# Patient Record
Sex: Female | Born: 1990 | Race: White | Hispanic: No | Marital: Single | Smoking: Never smoker
Health system: Southern US, Community
[De-identification: ages and names within clinical notes are randomized; demographics above are authoritative.]

## PROBLEM LIST (undated history)

## (undated) ENCOUNTER — Inpatient Hospital Stay (HOSPITAL_COMMUNITY): Payer: Self-pay

## (undated) DIAGNOSIS — J45909 Unspecified asthma, uncomplicated: Secondary | ICD-10-CM

## (undated) DIAGNOSIS — O24419 Gestational diabetes mellitus in pregnancy, unspecified control: Secondary | ICD-10-CM

## (undated) HISTORY — PX: NO PAST SURGERIES: SHX2092

---

## 2015-11-12 ENCOUNTER — Encounter (HOSPITAL_COMMUNITY): Payer: Self-pay | Admitting: Emergency Medicine

## 2015-11-12 ENCOUNTER — Emergency Department (HOSPITAL_COMMUNITY)
Admission: EM | Admit: 2015-11-12 | Discharge: 2015-11-12 | Disposition: A | Discharge status: Home / Self Care | Payer: Medicaid Other | Attending: Emergency Medicine | Admitting: Emergency Medicine

## 2015-11-12 ENCOUNTER — Emergency Department (HOSPITAL_COMMUNITY): Payer: Medicaid Other

## 2015-11-12 DIAGNOSIS — Z91040 Latex allergy status: Secondary | ICD-10-CM | POA: Insufficient documentation

## 2015-11-12 DIAGNOSIS — K920 Hematemesis: Secondary | ICD-10-CM

## 2015-11-12 LAB — COMPREHENSIVE METABOLIC PANEL
ALK PHOS: 40 U/L (ref 38–126)
ALT: 16 U/L (ref 14–54)
AST: 18 U/L (ref 15–41)
Albumin: 3.8 g/dL (ref 3.5–5.0)
Anion gap: 6 (ref 5–15)
BILIRUBIN TOTAL: 0.4 mg/dL (ref 0.3–1.2)
BUN: 8 mg/dL (ref 6–20)
CALCIUM: 9 mg/dL (ref 8.9–10.3)
CO2: 24 mmol/L (ref 22–32)
CREATININE: 0.35 mg/dL — AB (ref 0.44–1.00)
Chloride: 104 mmol/L (ref 101–111)
Glucose, Bld: 79 mg/dL (ref 65–99)
Potassium: 3.8 mmol/L (ref 3.5–5.1)
Sodium: 134 mmol/L — ABNORMAL LOW (ref 135–145)
Total Protein: 6.7 g/dL (ref 6.5–8.1)

## 2015-11-12 LAB — CBC WITH DIFFERENTIAL/PLATELET
Basophils Absolute: 0 10*3/uL (ref 0.0–0.1)
Basophils Relative: 0 %
EOS PCT: 1 %
Eosinophils Absolute: 0.1 10*3/uL (ref 0.0–0.7)
HEMATOCRIT: 33.9 % — AB (ref 36.0–46.0)
HEMOGLOBIN: 11.4 g/dL — AB (ref 12.0–15.0)
LYMPHS ABS: 1.6 10*3/uL (ref 0.7–4.0)
LYMPHS PCT: 18 %
MCH: 31.2 pg (ref 26.0–34.0)
MCHC: 33.6 g/dL (ref 30.0–36.0)
MCV: 92.9 fL (ref 78.0–100.0)
Monocytes Absolute: 0.8 10*3/uL (ref 0.1–1.0)
Monocytes Relative: 9 %
NEUTROS ABS: 6.3 10*3/uL (ref 1.7–7.7)
Neutrophils Relative %: 72 %
Platelets: 191 10*3/uL (ref 150–400)
RBC: 3.65 MIL/uL — AB (ref 3.87–5.11)
RDW: 12.3 % (ref 11.5–15.5)
WBC: 8.8 10*3/uL (ref 4.0–10.5)

## 2015-11-12 LAB — POC OCCULT BLOOD, ED: FECAL OCCULT BLD: NEGATIVE

## 2015-11-12 MED ORDER — FAMOTIDINE 20 MG PO TABS: 20.0000 mg | ORAL_TABLET | Freq: Two times a day (BID) | ORAL | 0 refills | Status: DC

## 2015-11-12 MED ORDER — DIBUCAINE 1 % EX OINT: TOPICAL_OINTMENT | Freq: Three times a day (TID) | CUTANEOUS | 0 refills | Status: DC | PRN

## 2015-11-12 NOTE — ED Provider Notes (Signed)
[facility]-EMERGENCY DEPT Provider Note   CSN: [id number] Arrival date & time: 11/12/15  0831     History   Chief Complaint Chief Complaint  Patient presents with  . Hematemesis  . Palpitations    HPI Taylor Harding is a 25 y.o. female.  Patient who is [redacted] weeks pregnant presents with complaint of hematemesis and vomiting 2 this morning after waking up. She had associated sensation of heart racing. Patient states that she slept poorly last night and had vague upper abdominal discomfort. She has not had symptoms like this in the past. Blood in the vomit was bright red clots reported. Patient has recently had nasal congestion but denies any nosebleeds. She has not had any problems like this during prior pregnancies. No lightheadedness or syncope. No melena or bloody stools. Mild epigastric tenderness currently but no other abdominal pain. No fevers. Patient denies alcohol use, heavy NSAID use. No easy bruising or bleeding from other sites. No treatments prior to arrival. Patient has not had significant GERD with this pregnancy. The onset of this condition was acute. The course is constant. Aggravating factors: none. Alleviating factors: none.        History reviewed. No pertinent past medical history.  There are no active problems to display for this patient.   History reviewed. No pertinent surgical history.  OB History    Gravida Para Term Preterm AB Living   1             SAB TAB Ectopic Multiple Live Births                   Home Medications    Prior to Admission medications   Medication Sig Start Date End Date Taking? Authorizing Provider  albuterol (PROVENTIL HFA;VENTOLIN HFA) 108 (90 Base) MCG/ACT inhaler Inhale 2 puffs into the lungs every 6 (six) hours as needed for wheezing or shortness of breath.   Yes Historical Provider, MD  Prenatal Vit-Fe Fumarate-FA (MULTIVITAMIN-PRENATAL) 27-0.8 MG TABS tablet Take 1 tablet by mouth daily at 12 noon.   Yes Historical  Provider, MD  famotidine (PEPCID) 20 MG tablet Take 1 tablet (20 mg total) by mouth 2 (two) times daily. 11/12/15   Renne Crigler, PA-C    Family History History reviewed. No pertinent family history.  Social History Social History  Substance Use Topics  . Smoking status: Never Smoker  . Smokeless tobacco: Never Used  . Alcohol use No     Allergies   Benadryl [diphenhydramine hcl (sleep)] and Latex   Review of Systems Review of Systems  Constitutional: Negative for fever.  HENT: Positive for congestion. Negative for nosebleeds, rhinorrhea and sore throat.   Eyes: Negative for redness.  Respiratory: Negative for cough.   Cardiovascular: Positive for palpitations. Negative for chest pain.  Gastrointestinal: Positive for abdominal pain, nausea and vomiting. Negative for blood in stool and diarrhea.  Genitourinary: Negative for dysuria.  Musculoskeletal: Negative for myalgias.  Skin: Positive for color change. Negative for rash.  Neurological: Negative for headaches.     Physical Exam Updated Vital Signs BP 123/63   Pulse 65   Temp 98.5 F (36.9 C) (Oral)   Resp 15   Ht 5\' 5"  (1.651 m)   Wt 61.9 kg   SpO2 99%   BMI 22.70 kg/m   Physical Exam  Constitutional: She appears well-developed and well-nourished.  HENT:  Head: Normocephalic and atraumatic.  Nose: Nose normal.  Mouth/Throat: Oropharynx is clear and moist.  Eyes: Conjunctivae are normal. Right  eye exhibits no discharge. Left eye exhibits no discharge.  Neck: Normal range of motion. Neck supple.  Cardiovascular: Normal rate, regular rhythm and normal heart sounds.   No murmur heard. Pulmonary/Chest: Effort normal and breath sounds normal. No respiratory distress. She has no wheezes. She has no rales.  Abdominal: Soft. There is no tenderness.  Neurological: She is alert.  Skin: Skin is warm and dry.  Psychiatric: She has a normal mood and affect.  Nursing note and vitals reviewed.    ED Treatments /  Results  Labs (all labs ordered are listed, but only abnormal results are displayed) Labs Reviewed  CBC WITH DIFFERENTIAL/PLATELET - Abnormal; Notable for the following:       Result Value   RBC 3.65 (*)    Hemoglobin 11.4 (*)    HCT 33.9 (*)    All other components within normal limits  COMPREHENSIVE METABOLIC PANEL - Abnormal; Notable for the following:    Sodium 134 (*)    Creatinine, Ser 0.35 (*)    All other components within normal limits  POC OCCULT BLOOD, ED    Radiology Dg Chest 1 View  Result Date: 11/12/2015 CLINICAL DATA:  Hematemesis EXAM: CHEST 1 VIEW COMPARISON:  None. FINDINGS: The heart size and mediastinal contours are within normal limits. Both lungs are clear. Question old fracture of the of the left fourth rib. Clinical correlation is necessary. IMPRESSION: No active disease. Electronically Signed   By: Natasha MeadLiviu  Pop M.D.   On: 11/12/2015 10:11    Procedures Procedures (including critical care time)  Medications Ordered in ED Medications - No data to display   Initial Impression / Assessment and Plan / ED Course  I have reviewed the triage vital signs and the nursing notes.  Pertinent labs & imaging results that were available during my care of the patient were reviewed by me and considered in my medical decision making (see chart for details).  Clinical Course   Patient seen and examined. Work-up initiated. Discussed with Dr. Radford PaxBeaton.   Vital signs reviewed and are as follows: BP 123/63   Pulse 65   Temp 98.5 F (36.9 C) (Oral)   Resp 15   Ht 5\' 5"  (1.651 m)   Wt 61.9 kg   SpO2 99%   BMI 22.70 kg/m   12:45 PM Patient Feeling better. Discussed results with Dr. Radford PaxBeaton. Patient is comfortable with discharge to home. Patient told to return if she has any persistent vomiting containing blood, abdominal pain, fever, new symptoms or other concerns. She has just established her medical coverage and is going to be scheduling a OB/GYN appointment tomorrow.  Patient verbalizes understanding and agrees with plan.    Final Clinical Impressions(s) / ED Diagnoses   Final diagnoses:  Hematemesis without nausea   Patient with episodes of reported hematemesis and palpitations this morning. EKG is normal. No significant abdominal pain. No concerning features for PUD. No nosebleeds. Vital signs are stable and have remained so during ED stay. Patient is drinking fluids without any difficulty. Do not suspect Boerhaave's. Do not suspect ruptured viscus. Discharged home with Pepcid. Return instructions as above.  New Prescriptions New Prescriptions   FAMOTIDINE (PEPCID) 20 MG TABLET    Take 1 tablet (20 mg total) by mouth 2 (two) times daily.     Renne CriglerJoshua Zen Felling, PA-C 11/12/15 1247    Nelva Nayobert Beaton, MD 11/18/15 [phone]

## 2015-11-12 NOTE — ED Notes (Signed)
PA at bedside.

## 2015-11-12 NOTE — Discharge Instructions (Signed)
Please read and follow all provided instructions.  Your diagnoses today include:  1. Hematemesis without nausea   2. Hematemesis     Tests performed today include:  Blood counts and electrolytes  Blood tests to check kidney function  Chest x-ray - no problems seen  Vital signs. See below for your results today.   Medications prescribed:   Pepcid (famotidine) - antihistamine  You can find this medication over-the-counter.   DO NOT exceed:   20mg  Pepcid every 12 hours  Take any prescribed medications only as directed.  Home care instructions:   Follow any educational materials contained in this packet.  Follow-up instructions: Please follow-up with your primary care provider in the next 3 days for further evaluation of your symptoms.    Return instructions:  SEEK IMMEDIATE MEDICAL ATTENTION IF:  The pain does not go away or becomes severe   A temperature above 101F develops   Repeated vomiting occurs (multiple episodes)   The pain becomes localized to portions of the abdomen. The right side could possibly be appendicitis. In an adult, the left lower portion of the abdomen could be colitis or diverticulitis.   Blood is being passed in stools or vomit for more than 12 hours (bright red or black tarry stools)   You develop chest pain, difficulty breathing, dizziness or fainting, or become confused, poorly responsive, or inconsolable (young children)  If you have any other emergent concerns regarding your health  Your vital signs today were: BP 123/63    Pulse 65    Temp 98.5 F (36.9 C) (Oral)    Resp 15    Ht 5\' 5"  (1.651 m)    Wt 61.9 kg    SpO2 99%    BMI 22.70 kg/m  If your blood pressure (bp) was elevated above 135/85 this visit, please have this repeated by your doctor within one month. --------------

## 2015-11-12 NOTE — ED Triage Notes (Signed)
Patient states she woke up and felt her heart racing. She states she started to throwing up and it was blood and yellow stuff in her vomit. Patient is [redacted] weeks pregnant. Patient also complaining of having a cold and feeling uncomfortable.

## 2015-11-12 NOTE — ED Notes (Signed)
Patient transported to X-ray 

## 2015-11-12 NOTE — ED Notes (Signed)
Wrong documentation at [id number]-KB

## 2015-12-05 ENCOUNTER — Other Ambulatory Visit (HOSPITAL_COMMUNITY)
Admission: RE | Admit: 2015-12-05 | Discharge: 2015-12-05 | Disposition: A | Discharge status: Home / Self Care | Payer: Medicaid Other | Source: Ambulatory Visit | Attending: Certified Nurse Midwife | Admitting: Certified Nurse Midwife

## 2015-12-05 ENCOUNTER — Encounter: Payer: Self-pay | Admitting: Certified Nurse Midwife

## 2015-12-05 ENCOUNTER — Ambulatory Visit (INDEPENDENT_AMBULATORY_CARE_PROVIDER_SITE_OTHER): Payer: Medicaid Other | Admitting: Certified Nurse Midwife

## 2015-12-05 VITALS — BP 119/74 | HR 73 | Temp 98.1°F | Wt 141.0 lb

## 2015-12-05 DIAGNOSIS — Z01419 Encounter for gynecological examination (general) (routine) without abnormal findings: Secondary | ICD-10-CM | POA: Diagnosis present

## 2015-12-05 DIAGNOSIS — Z3492 Encounter for supervision of normal pregnancy, unspecified, second trimester: Secondary | ICD-10-CM

## 2015-12-05 DIAGNOSIS — O0932 Supervision of pregnancy with insufficient antenatal care, second trimester: Secondary | ICD-10-CM | POA: Diagnosis not present

## 2015-12-05 DIAGNOSIS — Z113 Encounter for screening for infections with a predominantly sexual mode of transmission: Secondary | ICD-10-CM | POA: Insufficient documentation

## 2015-12-05 DIAGNOSIS — O0990 Supervision of high risk pregnancy, unspecified, unspecified trimester: Secondary | ICD-10-CM | POA: Insufficient documentation

## 2015-12-05 DIAGNOSIS — O09292 Supervision of pregnancy with other poor reproductive or obstetric history, second trimester: Secondary | ICD-10-CM | POA: Insufficient documentation

## 2015-12-05 MED ORDER — ONDANSETRON HCL 8 MG PO TABS: 8.0000 mg | ORAL_TABLET | Freq: Three times a day (TID) | ORAL | 2 refills | Status: DC | PRN

## 2015-12-05 MED ORDER — OB COMPLETE PETITE 35-5-1-200 MG PO CAPS: 1.0000 | ORAL_CAPSULE | Freq: Every day | ORAL | 12 refills | Status: DC

## 2015-12-05 NOTE — Progress Notes (Signed)
Subjective:    Taylor Harding is being seen today for her first obstetrical visit.  This is a planned pregnancy. She is at [redacted]w[redacted]d gestation. Her obstetrical history is significant for asthma,last exacurbation 3 years ago. Relationship with FOB: significant other, living together. Patient does intend to breast feed. Pregnancy history fully reviewed.  Currently employed: seasonal job.  Had IUD previously.    The information documented in the HPI was reviewed and verified.  Menstrual History: OB History    Gravida Para Term Preterm AB Living   3 1 1   1 1    SAB TAB Ectopic Multiple Live Births   1       1      Menarche age: 25 years of age.  Patient's last menstrual period was 08/06/2015 (exact date).    No past medical history on file.  No past surgical history on file.   (Not in a hospital admission) Allergies  Allergen Reactions  . Benadryl [Diphenhydramine Hcl (Sleep)] Anaphylaxis  . Latex Anaphylaxis    Social History  Substance Use Topics  . Smoking status: Never Smoker  . Smokeless tobacco: Never Used  . Alcohol use No    No family history on file.   Review of Systems Constitutional: negative for weight loss Gastrointestinal: negative for vomiting, + for nausea Genitourinary:negative for genital lesions and vaginal discharge and dysuria Musculoskeletal:negative for back pain Behavioral/Psych: negative for abusive relationship, depression, illegal drug usage and tobacco use    Objective:    BP 119/74   Pulse 73   Temp 98.1 F (36.7 C)   Wt 141 lb (64 kg)   LMP 08/06/2015 (Exact Date) Comment: double shielded, spoke to MD to verify if xray needed.  BMI 23.46 kg/m  General Appearance:    Alert, cooperative, no distress, appears stated age  Head:    Normocephalic, without obvious abnormality, atraumatic  Eyes:    PERRL, conjunctiva/corneas clear, EOM's intact, fundi    benign, both eyes  Ears:    Normal TM's and external ear canals, both ears  Nose:   Nares  normal, septum midline, mucosa normal, no drainage    or sinus tenderness  Throat:   Lips, mucosa, and tongue normal; teeth and gums normal  Neck:   Supple, symmetrical, trachea midline, no adenopathy;    thyroid:  no enlargement/tenderness/nodules; no carotid   bruit or JVD  Back:     Symmetric, no curvature, ROM normal, no CVA tenderness  Lungs:     Clear to auscultation bilaterally, respirations unlabored  Chest Wall:    No tenderness or deformity   Heart:    Regular rate and rhythm, S1 and S2 normal, no murmur, rub   or gallop  Breast Exam:    No tenderness, masses, or nipple abnormality  Abdomen:     Soft, non-tender, bowel sounds active all four quadrants,    no masses, no organomegaly  Genitalia:    Normal female without lesion, discharge or tenderness  Extremities:   Extremities normal, atraumatic, no cyanosis or edema  Pulses:   2+ and symmetric all extremities  Skin:   Skin color, texture, turgor normal, no rashes or lesions  Lymph nodes:   Cervical, supraclavicular, and axillary nodes normal  Neurologic:   CNII-XII intact, normal strength, sensation and reflexes    throughout          Cervix:   Long, thick, closed and posterior.  FHR:145   By doppler.  FH: less than U.   Lab  Review Urine pregnancy test Labs reviewed yes Radiologic studies reviewed no Assessment:    Pregnancy at 571w2d weeks   History of miscarriage @18  weeks with last prenancy Late to prenatal care @18  weeks  Plan:      Prenatal vitamins.  Counseling provided regarding continued use of seat belts, cessation of alcohol consumption, smoking or use of illicit drugs; infection precautions i.e., influenza/TDAP immunizations, toxoplasmosis,CMV, parvovirus, listeria and varicella; workplace safety, exercise during pregnancy; routine dental care, safe medications, sexual activity, hot tubs, saunas, pools, travel, caffeine use, fish and methlymercury, potential toxins, hair treatments, varicose veins Weight gain  recommendations per IOM guidelines reviewed: underweight/BMI< 18.5--> gain 28 - 40 lbs; normal weight/BMI 18.5 - 24.9--> gain 25 - 35 lbs; overweight/BMI 25 - 29.9--> gain 15 - 25 lbs; obese/BMI >30->gain  11 - 20 lbs Problem list reviewed and updated. FIRST/CF mutation testing/NIPT/QUAD SCREEN/fragile X/Ashkenazi Jewish population testing/Spinal muscular atrophy discussed: ordered. Role of ultrasound in pregnancy discussed; fetal survey: ordered. Amniocentesis discussed: not indicated. VBAC calculator score: VBAC consent form provided Meds ordered this encounter  Medications  . Prenat-FeCbn-FeAspGl-FA-Omega (OB COMPLETE PETITE) 35-5-1-200 MG CAPS    Sig: Take 1 tablet by mouth daily.    Dispense:  30 capsule    Refill:  12  . ondansetron (ZOFRAN) 8 MG tablet    Sig: Take 1 tablet (8 mg total) by mouth every 8 (eight) hours as needed for nausea or vomiting.    Dispense:  40 tablet    Refill:  2   Orders Placed This Encounter  Procedures  . Culture, OB Urine  . US MFM OB COMP + 14 WK    Standing Status:   Future    Standing Expiration Date:   02/03/2017    Order Specific Question:   Reason for Exam (SYMPTOM  OR DIAGNOSIS REQUIRED)    Answer:   fetal anatomy scan, hx of SAB @18  weeks    Order Specific Question:   Preferred imaging location?    Answer:   [facility]Ultrasound  . Obstetric Panel, Including HIV  . Varicella zoster antibody, IgG  . ToxASSURE Select 13 (MW), Urine  . Hemoglobinopathy evaluation  . MaterniT21 PLUS Core+SCA    Order Specific Question:   Is the patient insulin dependent?    Answer:   No    Order Specific Question:   Please enter gestational age. This should be expressed as weeks AND days, i.e. 16w 6d. Enter weeks here. Enter days in next question.    Answer:   2817    Order Specific Question:   Please enter gestational age. This should be expressed as weeks AND days, i.e. 16w 6d. Enter days here. Enter weeks in previous question.    Answer:   2    Order Specific  Question:   How was gestational age calculated?    Answer:   LMP    Order Specific Question:   Please give the date of LMP OR Ultrasound OR Estimated date of delivery.    Answer:   05/12/2016    Order Specific Question:   Number of Fetuses (Type of Pregnancy):    Answer:   1    Order Specific Question:   Indications for performing the test? (please choose all that apply):    Answer:   Routine screening    Order Specific Question:   Other Indications? (Y=Yes, N=No)    Answer:   N    Order Specific Question:   If this is a repeat specimen, please  indicate the reason:    Answer:   Not indicated    Order Specific Question:   Please specify the patient's race: (C=White/Caucasion, B=Black, I=Native American, A=Asian, H=Hispanic, O=Other, U=Unknown)    Answer:   C    Order Specific Question:   Donor Egg - indicate if the egg was obtained from in vitro fertilization.    Answer:   N    Order Specific Question:   Age of Egg Donor.    Answer:   [age]    Order Specific Question:   Prior Down Syndrome/ONTD screening during current pregnancy.    Answer:   N    Order Specific Question:   Prior First Trimester Testing    Answer:   N    Order Specific Question:   Prior Second Trimester Testing    Answer:   N    Order Specific Question:   Family History of Neural Tube Defects    Answer:   N    Order Specific Question:   Prior Pregnancy with Down Syndrome    Answer:   N    Order Specific Question:   Please give the patient's weight (in pounds)    Answer:   141  . Hemoglobin A1c  . Cystic Fibrosis Mutation 97  . TSH  . NuSwab Vaginitis Plus (VG+)    Follow up in 4 weeks. 50% of 30 min visit spent on counseling and coordination of care.

## 2015-12-05 NOTE — Progress Notes (Signed)
Patient declines FLU Vaccine at this time

## 2015-12-06 ENCOUNTER — Encounter: Payer: Self-pay | Admitting: Certified Nurse Midwife

## 2015-12-06 LAB — CYTOLOGY - PAP: DIAGNOSIS: NEGATIVE

## 2015-12-07 LAB — GC/CHLAMYDIA PROBE AMP (~~LOC~~) NOT AT ARMC
CHLAMYDIA, DNA PROBE: NEGATIVE
NEISSERIA GONORRHEA: NEGATIVE

## 2015-12-07 LAB — URINE CULTURE, OB REFLEX: ORGANISM ID, BACTERIA: NO GROWTH

## 2015-12-07 LAB — CULTURE, OB URINE

## 2015-12-08 LAB — NUSWAB VAGINITIS PLUS (VG+)
Atopobium vaginae: HIGH Score — AB
BVAB 2: HIGH {score} — AB
CANDIDA GLABRATA, NAA: NEGATIVE
Candida albicans, NAA: NEGATIVE
Chlamydia trachomatis, NAA: NEGATIVE
MEGASPHAERA 1: HIGH {score} — AB
Neisseria gonorrhoeae, NAA: NEGATIVE
TRICH VAG BY NAA: NEGATIVE

## 2015-12-12 ENCOUNTER — Encounter: Payer: Self-pay | Admitting: *Deleted

## 2015-12-12 ENCOUNTER — Other Ambulatory Visit: Payer: Self-pay | Admitting: Certified Nurse Midwife

## 2015-12-12 DIAGNOSIS — N760 Acute vaginitis: Principal | ICD-10-CM

## 2015-12-12 DIAGNOSIS — B9689 Other specified bacterial agents as the cause of diseases classified elsewhere: Secondary | ICD-10-CM

## 2015-12-12 LAB — OBSTETRIC PANEL, INCLUDING HIV
Antibody Screen: NEGATIVE
Basophils Absolute: 0 10*3/uL (ref 0.0–0.2)
Basos: 0 %
EOS (ABSOLUTE): 0.1 10*3/uL (ref 0.0–0.4)
EOS: 1 %
HEP B S AG: NEGATIVE
HIV SCREEN 4TH GENERATION: NONREACTIVE
Hematocrit: 35.5 % (ref 34.0–46.6)
Hemoglobin: 11.8 g/dL (ref 11.1–15.9)
IMMATURE GRANULOCYTES: 1 %
Immature Grans (Abs): 0.1 10*3/uL (ref 0.0–0.1)
LYMPHS ABS: 2.9 10*3/uL (ref 0.7–3.1)
Lymphs: 21 %
MCH: 30.9 pg (ref 26.6–33.0)
MCHC: 33.2 g/dL (ref 31.5–35.7)
MCV: 93 fL (ref 79–97)
MONOS ABS: 0.8 10*3/uL (ref 0.1–0.9)
Monocytes: 6 %
NEUTROS ABS: 9.8 10*3/uL — AB (ref 1.4–7.0)
NEUTROS PCT: 71 %
PLATELETS: 218 10*3/uL (ref 150–379)
RBC: 3.82 x10E6/uL (ref 3.77–5.28)
RDW: 13.4 % (ref 12.3–15.4)
RH TYPE: POSITIVE
RPR: NONREACTIVE
Rubella Antibodies, IGG: 4.88 index (ref >0.99)
WBC: 13.7 10*3/uL — AB (ref 3.4–10.8)

## 2015-12-12 LAB — HEMOGLOBINOPATHY EVALUATION
HEMOGLOBIN A2 QUANTITATION: 2.1 % (ref 0.7–3.1)
HGB C: 0 %
HGB S: 0 %
Hemoglobin F Quantitation: 0 % (ref 0.0–2.0)
Hgb A: 97.9 % (ref 94.0–98.0)

## 2015-12-12 LAB — HEMOGLOBIN A1C
ESTIMATED AVERAGE GLUCOSE: 91 mg/dL
Hgb A1c MFr Bld: 4.8 % (ref 4.8–5.6)

## 2015-12-12 LAB — CYSTIC FIBROSIS MUTATION 97: GENE DIS ANAL CARRIER INTERP BLD/T-IMP: NOT DETECTED

## 2015-12-12 LAB — TSH: TSH: 0.721 u[IU]/mL (ref 0.450–4.500)

## 2015-12-12 LAB — VARICELLA ZOSTER ANTIBODY, IGG: Varicella zoster IgG: 413 index (ref >165)

## 2015-12-12 LAB — TOXASSURE SELECT 13 (MW), URINE

## 2015-12-12 MED ORDER — METRONIDAZOLE 500 MG PO TABS: 500.0000 mg | ORAL_TABLET | Freq: Two times a day (BID) | ORAL | 0 refills | Status: DC

## 2015-12-13 ENCOUNTER — Other Ambulatory Visit: Payer: Self-pay | Admitting: Certified Nurse Midwife

## 2015-12-13 DIAGNOSIS — O0990 Supervision of high risk pregnancy, unspecified, unspecified trimester: Secondary | ICD-10-CM

## 2015-12-13 LAB — MATERNIT21 PLUS CORE+SCA
CHROMOSOME 13: NEGATIVE
CHROMOSOME 18: NEGATIVE
CHROMOSOME 21: NEGATIVE
Y CHROMOSOME: NOT DETECTED

## 2015-12-20 ENCOUNTER — Encounter (HOSPITAL_COMMUNITY): Payer: Self-pay

## 2015-12-20 ENCOUNTER — Ambulatory Visit (HOSPITAL_COMMUNITY)
Admission: RE | Admit: 2015-12-20 | Discharge: 2015-12-20 | Disposition: A | Discharge status: Home / Self Care | Payer: Medicaid Other | Source: Ambulatory Visit | Attending: Certified Nurse Midwife | Admitting: Certified Nurse Midwife

## 2015-12-20 ENCOUNTER — Other Ambulatory Visit: Payer: Self-pay | Admitting: Certified Nurse Midwife

## 2015-12-20 DIAGNOSIS — Z3A19 19 weeks gestation of pregnancy: Secondary | ICD-10-CM

## 2015-12-20 DIAGNOSIS — O09292 Supervision of pregnancy with other poor reproductive or obstetric history, second trimester: Secondary | ICD-10-CM | POA: Diagnosis not present

## 2015-12-20 DIAGNOSIS — Z363 Encounter for antenatal screening for malformations: Secondary | ICD-10-CM

## 2015-12-20 DIAGNOSIS — Z3492 Encounter for supervision of normal pregnancy, unspecified, second trimester: Secondary | ICD-10-CM

## 2015-12-20 HISTORY — DX: Unspecified asthma, uncomplicated: J45.909

## 2015-12-26 ENCOUNTER — Other Ambulatory Visit: Payer: Self-pay | Admitting: Certified Nurse Midwife

## 2015-12-26 DIAGNOSIS — O0990 Supervision of high risk pregnancy, unspecified, unspecified trimester: Secondary | ICD-10-CM

## 2016-01-02 ENCOUNTER — Ambulatory Visit (INDEPENDENT_AMBULATORY_CARE_PROVIDER_SITE_OTHER): Payer: Medicaid Other | Admitting: Obstetrics & Gynecology

## 2016-01-02 VITALS — BP 121/64 | HR 85 | Wt 144.0 lb

## 2016-01-02 DIAGNOSIS — O0932 Supervision of pregnancy with insufficient antenatal care, second trimester: Secondary | ICD-10-CM

## 2016-01-02 DIAGNOSIS — O0990 Supervision of high risk pregnancy, unspecified, unspecified trimester: Secondary | ICD-10-CM

## 2016-01-02 NOTE — Progress Notes (Signed)
Pt states that she did have some cramping yesterday and felt faint.  Pt states she rest and felt better.

## 2016-01-02 NOTE — Progress Notes (Signed)
   PRENATAL VISIT NOTE  Subjective:  Taylor Harding is a 25 y.o. G3P1011 at 284w2d being seen today for ongoing prenatal care.  She is currently monitored for the following issues for this low-risk pregnancy and has Supervision of high risk pregnancy, antepartum; Late prenatal care affecting pregnancy in second trimester; and H/O miscarriage, currently pregnant, second trimester on her problem list.  Patient reports no complaints.  Contractions: Not present. Vag. Bleeding: None.  Movement: Present. Denies leaking of fluid.   The following portions of the patient's history were reviewed and updated as appropriate: allergies, current medications, past family history, past medical history, past social history, past surgical history and problem list. Problem list updated.  Objective:   Vitals:   01/02/16 0859  BP: 121/64  Pulse: 85  Weight: 144 lb (65.3 kg)    Fetal Status: Fetal Heart Rate (bpm): 136   Movement: Present     General:  Alert, oriented and cooperative. Patient is in no acute distress.  Skin: Skin is warm and dry. No rash noted.   Cardiovascular: Normal heart rate noted  Respiratory: Normal respiratory effort, no problems with respiration noted  Abdomen: Soft, gravid, appropriate for gestational age. Pain/Pressure: Absent     Pelvic:  Cervical exam deferred        Extremities: Normal range of motion.     Mental Status: Normal mood and affect. Normal behavior. Normal judgment and thought content.   Assessment and Plan:  Pregnancy: G3P1011 at 244w2d  1. Supervision of high risk pregnancy, antepartum   2. Late prenatal care affecting pregnancy in second trimester   Preterm labor symptoms and general obstetric precautions including but not limited to vaginal bleeding, contractions, leaking of fluid and fetal movement were reviewed in detail with the patient. Please refer to After Visit Summary for other counseling recommendations.  No Follow-up on file.   Allie BossierMyra C  Yulisa Chirico, MD

## 2016-01-10 ENCOUNTER — Encounter (HOSPITAL_COMMUNITY): Payer: Self-pay | Admitting: Neurology

## 2016-01-10 ENCOUNTER — Emergency Department (HOSPITAL_COMMUNITY)
Admission: EM | Admit: 2016-01-10 | Discharge: 2016-01-10 | Disposition: A | Discharge status: Home / Self Care | Payer: Medicaid Other | Attending: Emergency Medicine | Admitting: Emergency Medicine

## 2016-01-10 DIAGNOSIS — O99712 Diseases of the skin and subcutaneous tissue complicating pregnancy, second trimester: Secondary | ICD-10-CM | POA: Insufficient documentation

## 2016-01-10 DIAGNOSIS — R233 Spontaneous ecchymoses: Secondary | ICD-10-CM | POA: Diagnosis not present

## 2016-01-10 DIAGNOSIS — Z79899 Other long term (current) drug therapy: Secondary | ICD-10-CM | POA: Diagnosis not present

## 2016-01-10 DIAGNOSIS — Z3A22 22 weeks gestation of pregnancy: Secondary | ICD-10-CM | POA: Diagnosis not present

## 2016-01-10 DIAGNOSIS — J45909 Unspecified asthma, uncomplicated: Secondary | ICD-10-CM | POA: Diagnosis not present

## 2016-01-10 NOTE — ED Provider Notes (Signed)
[facility]-EMERGENCY DEPT Provider Note   CSN: [id number] Arrival date & time: 01/10/16  1644  By signing my name below, I, Taylor Harding, attest that this documentation has been prepared under the direction and in the presence of Rolland PorterMark Braian Tijerina, MD. Electronically Signed: Rosario AdieWilliam Andrew Harding, ED Scribe. 01/10/16. 6:01 PM.  History   Chief Complaint Chief Complaint  Patient presents with  . Rash   The history is provided by the patient. No language interpreter was used.   HPI Comments: Taylor Harding is a 25 y.o. female who is [redacted] week pregnant, with a PMHx of asthma, who presents to the Emergency Department complaining of unchanged rash to her face onset 2.5 hour ago. She states that the rash is otherwise not pruritic and non-painful. Pt notes that she was putting on her makeup tonight when noticed her rash. No new soaps, lotions, detergents, foods, animals, plants, or medications otherwise. She states that she prior to the onset of her rash that she had one episode of emesis this morning, which has been typical of her pregnancy. No noted treatments were tried prior to coming into the ED. She denies abdominal pain, trouble swallowing, shortness of breath, or any other associated symptoms.   Past Medical History:  Diagnosis Date  . Asthma    Patient Active Problem List   Diagnosis Date Noted  . Supervision of high risk pregnancy, antepartum 12/05/2015  . Late prenatal care affecting pregnancy in second trimester 12/05/2015  . H/O miscarriage, currently pregnant, second trimester 12/05/2015   Past Surgical History:  Procedure Laterality Date  . NO PAST SURGERIES     OB History    Gravida Para Term Preterm AB Living   3 1 1   1 1    SAB TAB Ectopic Multiple Live Births   1       1     Home Medications    Prior to Admission medications   Medication Sig Start Date End Date Taking? Authorizing Provider  albuterol (PROVENTIL HFA;VENTOLIN HFA) 108 (90 Base) MCG/ACT inhaler Inhale  2 puffs into the lungs every 6 (six) hours as needed for wheezing or shortness of breath.    Historical Provider, MD  famotidine (PEPCID) 20 MG tablet Take 1 tablet (20 mg total) by mouth 2 (two) times daily. Patient not taking: Reported on 12/20/2015 11/12/15   Renne CriglerJoshua Geiple, PA-C  metroNIDAZOLE (FLAGYL) 500 MG tablet Take 1 tablet (500 mg total) by mouth 2 (two) times daily. 12/12/15   Rachelle A Denney, CNM  Prenat-FeCbn-FeAspGl-FA-Omega (OB COMPLETE PETITE) 35-5-1-200 MG CAPS Take 1 tablet by mouth daily. 12/05/15   Roe Coombsachelle A Denney, CNM  Prenatal Vit-Fe Fumarate-FA (MULTIVITAMIN-PRENATAL) 27-0.8 MG TABS tablet Take 1 tablet by mouth daily at 12 noon.    Historical Provider, MD   Family History No family history on file.  Social History Social History  Substance Use Topics  . Smoking status: Never Smoker  . Smokeless tobacco: Never Used  . Alcohol use No   Allergies   Benadryl [diphenhydramine hcl (sleep)] and Latex  Review of Systems Review of Systems  Constitutional: Negative for appetite change, chills, diaphoresis, fatigue and fever.  HENT: Negative for mouth sores, sore throat and trouble swallowing.   Eyes: Negative for visual disturbance.  Respiratory: Negative for cough, chest tightness, shortness of breath and wheezing.   Cardiovascular: Negative for chest pain.  Gastrointestinal: Negative for abdominal distention, abdominal pain, diarrhea, nausea and vomiting.  Endocrine: Negative for polydipsia, polyphagia and polyuria.  Genitourinary: Negative for  dysuria, frequency and hematuria.  Musculoskeletal: Negative for gait problem.  Skin: Positive for rash. Negative for color change and pallor.  Neurological: Negative for dizziness, syncope, light-headedness and headaches.  Hematological: Does not bruise/bleed easily.  Psychiatric/Behavioral: Negative for behavioral problems and confusion.   Physical Exam Updated Vital Signs BP 121/67 (BP Location: Left Arm)   Pulse  79   Temp 98.3 F (36.8 C) (Oral)   Resp 16   Ht 5\' 5"  (1.651 m)   Wt 144 lb (65.3 kg)   LMP 08/06/2015 (Exact Date) Comment: double shielded, spoke to MD to verify if xray needed.  SpO2 100%   BMI 23.96 kg/m   Physical Exam  Constitutional: She is oriented to person, place, and time. She appears well-developed and well-nourished. No distress.  HENT:  Head: Normocephalic.  Eyes: Conjunctivae are normal. Pupils are equal, round, and reactive to light. No scleral icterus.  Neck: Normal range of motion. Neck supple. No thyromegaly present.  Cardiovascular: Normal rate and regular rhythm.  Exam reveals no gallop and no friction rub.   No murmur heard. Pulmonary/Chest: Effort normal and breath sounds normal. No respiratory distress. She has no wheezes. She has no rales.  Abdominal: Soft. Bowel sounds are normal. She exhibits no distension. There is no tenderness. There is no rebound.  Musculoskeletal: Normal range of motion.  Neurological: She is alert and oriented to person, place, and time.  Skin: Skin is warm and dry.  Multiple petechiae noted to the facial region.   Psychiatric: She has a normal mood and affect. Her behavior is normal.   ED Treatments / Results  DIAGNOSTIC STUDIES: Oxygen Saturation is 100% on RA, normal by my interpretation.   COORDINATION OF CARE: 5:57 PM-Discussed next steps with pt. Pt verbalized understanding and is agreeable with the plan.   Labs (all labs ordered are listed, but only abnormal results are displayed) Labs Reviewed - No data to display  EKG  EKG Interpretation None       Radiology No results found.  Procedures Procedures (including critical care time)  Medications Ordered in ED Medications - No data to display   Initial Impression / Assessment and Plan / ED Course  I have reviewed the triage vital signs and the nursing notes.  Pertinent labs & imaging results that were available during my care of the patient were  reviewed by me and considered in my medical decision making (see chart for details).  Clinical Course    Petechiae on the face and conjunctiva. No dependent petechiae or any other areas of skin abnormality. Likely secondary to forced emesis this morning. No specific treatment.   Final Clinical Impressions(s) / ED Diagnoses   Final diagnoses:  Traumatic petechiae   New Prescriptions New Prescriptions   No medications on file       Rolland PorterMark Janayia Burggraf, MD 01/10/16 [phone]

## 2016-01-10 NOTE — Discharge Instructions (Signed)
No specific treatment.

## 2016-01-10 NOTE — ED Triage Notes (Signed)
Pt reports rash to face that she noticed 1 hr ago, denies itching. Is also [redacted] weeks pregnant. Denies abd pain.

## 2016-01-22 NOTE — L&D Delivery Note (Signed)
Patient is 26 y.o. G2X5284 [redacted]w[redacted]d admitted for IOL for gHTN and DM. She had few variable and late decels here and there earlier during labor that has resolved.   Delivery Note At 6:30 AM a viable female was delivered via SVD with tight nuchal cord x2 that was clamped x2 and cut at perineum.   (Presentation LOA ). Shoulders delivered with ease.  APGAR:8,9 ; weight 2840g (6lb 4.2oz). Placenta status was normal and complete with three vessel cord. Cord pH was collected for tight nuchal cord.  Anesthesia:  none Episiotomy:  none Lacerations:  none Suture Repair: n/a Est. Blood Loss (mL):  150 mls  Cord gas pH 7.285  Mom to postpartum.  Baby to Couplet care / Skin to Skin.  Almon Hercules 04/24/2016, 6:48 AM   OB FELLOW DELIVERY ATTESTATION  I was gloved and present for the delivery in its entirety, and I agree with the above resident's note.    Jen Mow, DO OB Fellow

## 2016-01-25 ENCOUNTER — Telehealth: Payer: Self-pay | Admitting: *Deleted

## 2016-01-26 ENCOUNTER — Ambulatory Visit (INDEPENDENT_AMBULATORY_CARE_PROVIDER_SITE_OTHER): Payer: Medicaid Other | Admitting: Certified Nurse Midwife

## 2016-01-26 VITALS — BP 120/75 | HR 80 | Wt 145.0 lb

## 2016-01-26 DIAGNOSIS — O0932 Supervision of pregnancy with insufficient antenatal care, second trimester: Secondary | ICD-10-CM

## 2016-01-26 DIAGNOSIS — O09292 Supervision of pregnancy with other poor reproductive or obstetric history, second trimester: Secondary | ICD-10-CM

## 2016-01-26 DIAGNOSIS — O0990 Supervision of high risk pregnancy, unspecified, unspecified trimester: Secondary | ICD-10-CM

## 2016-01-26 NOTE — Patient Instructions (Signed)

## 2016-01-26 NOTE — Progress Notes (Signed)
Subjective:    Taylor Harding is a 26 y.o. female being seen today for her obstetrical visit. She is at 5323w5d gestation. Patient reports: no complaints . Fetal movement: normal.  Reports +FM.   Problem List Items Addressed This Visit      Other   Supervision of high risk pregnancy, antepartum   Late prenatal care affecting pregnancy in second trimester   H/O miscarriage, currently pregnant, second trimester - Primary     Patient Active Problem List   Diagnosis Date Noted  . Supervision of high risk pregnancy, antepartum 12/05/2015  . Late prenatal care affecting pregnancy in second trimester 12/05/2015  . H/O miscarriage, currently pregnant, second trimester 12/05/2015   Objective:    BP 120/75   Pulse 80   Wt 145 lb (65.8 kg)   LMP 08/06/2015 (Exact Date) Comment: double shielded, spoke to MD to verify if xray needed.  BMI 24.13 kg/m  FHT: 131 BPM  Uterine Size: 24 cm and size equals dates     Assessment:    Pregnancy @ 6623w5d    Doing well  Plan:   F/U US ordered for mid Feburary  OBGCT: discussed and ordered for next visit. Signs and symptoms of preterm labor: discussed and handout given.  Labs, problem list reviewed and updated 2 hr GTT planned Follow up in 3 weeks.

## 2016-01-30 ENCOUNTER — Encounter: Payer: Medicaid Other | Admitting: Certified Nurse Midwife

## 2016-01-31 NOTE — Telephone Encounter (Signed)
error 

## 2016-02-12 ENCOUNTER — Other Ambulatory Visit: Payer: Medicaid Other

## 2016-02-12 ENCOUNTER — Ambulatory Visit (INDEPENDENT_AMBULATORY_CARE_PROVIDER_SITE_OTHER): Payer: Medicaid Other | Admitting: Obstetrics and Gynecology

## 2016-02-12 VITALS — BP 128/75 | HR 67 | Wt 147.0 lb

## 2016-02-12 DIAGNOSIS — Z3493 Encounter for supervision of normal pregnancy, unspecified, third trimester: Secondary | ICD-10-CM

## 2016-02-12 DIAGNOSIS — O0990 Supervision of high risk pregnancy, unspecified, unspecified trimester: Secondary | ICD-10-CM

## 2016-02-12 DIAGNOSIS — Z3490 Encounter for supervision of normal pregnancy, unspecified, unspecified trimester: Secondary | ICD-10-CM

## 2016-02-12 DIAGNOSIS — L723 Sebaceous cyst: Secondary | ICD-10-CM | POA: Insufficient documentation

## 2016-02-12 MED ORDER — CEPHALEXIN 500 MG PO CAPS: 500.0000 mg | ORAL_CAPSULE | Freq: Three times a day (TID) | ORAL | 0 refills | Status: DC

## 2016-02-12 NOTE — Progress Notes (Signed)
Subjective:  Taylor Harding is a 26 y.o. G3P1011 at 7549w1d being seen today for ongoing prenatal care.  She is currently monitored for the following issues for this high-risk pregnancy and has Supervision of high risk pregnancy, antepartum; Late prenatal care affecting pregnancy in second trimester; H/O miscarriage, currently pregnant, second trimester; and Sebaceous cyst on her problem list.  Patient reports a sore area on her right shoulder. Area has been there for some time, but has now increased in size and is painful to the touch. .  Contractions: Not present. Vag. Bleeding: None.  Movement: Present. Denies leaking of fluid.   The following portions of the patient's history were reviewed and updated as appropriate: allergies, current medications, past family history, past medical history, past social history, past surgical history and problem list. Problem list updated.  Objective:   Vitals:   02/12/16 0845  BP: 128/75  Pulse: 67  Weight: 147 lb (66.7 kg)    Fetal Status: Fetal Heart Rate (bpm): 145   Movement: Present     General:  Alert, oriented and cooperative. Patient is in no acute distress.  Skin: Skin is warm and dry. No rash noted.   Cardiovascular: Normal heart rate noted  Respiratory: Normal respiratory effort, no problems with respiration noted  Abdomen: Soft, gravid, appropriate for gestational age. Pain/Pressure: Absent     Pelvic:  Cervical exam deferred        Extremities: Normal range of motion.  Edema: None  Mental Status: Normal mood and affect. Normal behavior. Normal judgment and thought content.  Skin: Sebaceous cyst   Urinalysis:      Assessment and Plan:  Pregnancy: G3P1011 at 849w1d  1. Encounter for supervision of normal pregnancy, antepartum, unspecified gravidity  - Glucose Tolerance, 2 Hours w/1 Hour - CBC - HIV antibody (with reflex) - RPR  2. Supervision of high risk pregnancy, antepartum   3. Sebaceous cyst Area of concern consistent  with sebaceous cyst. Pt instructed to use warm compresses and antibiotics. Will reevaluate in 2 weeks for possible removal. - cephALEXin (KEFLEX) 500 MG capsule; Take 1 capsule (500 mg total) by mouth 3 (three) times daily.  Dispense: 21 capsule; Refill: 0  Preterm labor symptoms and general obstetric precautions including but not limited to vaginal bleeding, contractions, leaking of fluid and fetal movement were reviewed in detail with the patient. Please refer to After Visit Summary for other counseling recommendations.  No Follow-up on file.   Hermina StaggersMichael L Trianna Lupien, MD

## 2016-02-12 NOTE — Patient Instructions (Signed)
Epidermal Cyst  An epidermal cyst is sometimes called a sebaceous cyst, epidermal inclusion cyst, or infundibular cyst. These cysts usually contain a substance that looks "pasty" or "cheesy" and may have a bad smell. This substance is a protein called keratin. Epidermal cysts are usually found on the face, neck, or trunk. They may also occur in the vaginal area or other parts of the genitalia of both men and women. Epidermal cysts are usually small, painless, slow-growing bumps or lumps that move freely under the skin. It is important not to try to pop them. This may cause an infection and lead to tenderness and swelling.  CAUSES   Epidermal cysts may be caused by a deep penetrating injury to the skin or a plugged hair follicle, often associated with acne.  SYMPTOMS   Epidermal cysts can become inflamed and cause:  · Redness.  · Tenderness.  · Increased temperature of the skin over the bumps or lumps.  · Grayish-white, bad smelling material that drains from the bump or lump.  DIAGNOSIS   Epidermal cysts are easily diagnosed by your caregiver during an exam. Rarely, a tissue sample (biopsy) may be taken to rule out other conditions that may resemble epidermal cysts.  TREATMENT   · Epidermal cysts often get better and disappear on their own. They are rarely ever cancerous.  · If a cyst becomes infected, it may become inflamed and tender. This may require opening and draining the cyst. Treatment with antibiotics may be necessary. When the infection is gone, the cyst may be removed with minor surgery.  · Small, inflamed cysts can often be treated with antibiotics or by injecting steroid medicines.  · Sometimes, epidermal cysts become large and bothersome. If this happens, surgical removal in your caregiver's office may be necessary.  HOME CARE INSTRUCTIONS  · Only take over-the-counter or prescription medicines as directed by your caregiver.  · Take your antibiotics as directed. Finish them even if you start to feel  better.  SEEK MEDICAL CARE IF:   · Your cyst becomes tender, red, or swollen.  · Your condition is not improving or is getting worse.  · You have any other questions or concerns.  MAKE SURE YOU:  · Understand these instructions.  · Will watch your condition.  · Will get help right away if you are not doing well or get worse.     This information is not intended to replace advice given to you by your health care provider. Make sure you discuss any questions you have with your health care provider.     Document Released: 12/09/2003 Document Revised: 04/01/2011 Document Reviewed: 11/09/2014  Elsevier Interactive Patient Education ©2017 [employer]

## 2016-02-12 NOTE — Progress Notes (Signed)
Patient is in the office, reports good fetal movement. 

## 2016-02-13 LAB — CBC
HEMATOCRIT: 35.3 % (ref 34.0–46.6)
HEMOGLOBIN: 11.8 g/dL (ref 11.1–15.9)
MCH: 31.7 pg (ref 26.6–33.0)
MCHC: 33.4 g/dL (ref 31.5–35.7)
MCV: 95 fL (ref 79–97)
Platelets: 201 10*3/uL (ref 150–379)
RBC: 3.72 x10E6/uL — ABNORMAL LOW (ref 3.77–5.28)
RDW: 12.8 % (ref 12.3–15.4)
WBC: 9.8 10*3/uL (ref 3.4–10.8)

## 2016-02-13 LAB — GLUCOSE TOLERANCE, 2 HOURS W/ 1HR
GLUCOSE, 2 HOUR: 181 mg/dL — AB (ref 65–152)
Glucose, 1 hour: 195 mg/dL — ABNORMAL HIGH (ref 65–179)
Glucose, Fasting: 76 mg/dL (ref 65–91)

## 2016-02-13 LAB — RPR: RPR: NONREACTIVE

## 2016-02-13 LAB — HIV ANTIBODY (ROUTINE TESTING W REFLEX): HIV Screen 4th Generation wRfx: NONREACTIVE

## 2016-02-17 ENCOUNTER — Emergency Department (HOSPITAL_COMMUNITY)
Admission: EM | Admit: 2016-02-17 | Discharge: 2016-02-17 | Disposition: A | Discharge status: Home / Self Care | Payer: Medicaid Other | Attending: Emergency Medicine | Admitting: Emergency Medicine

## 2016-02-17 ENCOUNTER — Encounter (HOSPITAL_COMMUNITY): Payer: Self-pay

## 2016-02-17 DIAGNOSIS — Z3A28 28 weeks gestation of pregnancy: Secondary | ICD-10-CM | POA: Insufficient documentation

## 2016-02-17 DIAGNOSIS — O99712 Diseases of the skin and subcutaneous tissue complicating pregnancy, second trimester: Secondary | ICD-10-CM | POA: Insufficient documentation

## 2016-02-17 DIAGNOSIS — J45909 Unspecified asthma, uncomplicated: Secondary | ICD-10-CM | POA: Diagnosis not present

## 2016-02-17 DIAGNOSIS — Z91040 Latex allergy status: Secondary | ICD-10-CM | POA: Diagnosis not present

## 2016-02-17 DIAGNOSIS — L02212 Cutaneous abscess of back [any part, except buttock]: Secondary | ICD-10-CM | POA: Insufficient documentation

## 2016-02-17 DIAGNOSIS — L0291 Cutaneous abscess, unspecified: Secondary | ICD-10-CM

## 2016-02-17 DIAGNOSIS — Z79899 Other long term (current) drug therapy: Secondary | ICD-10-CM | POA: Insufficient documentation

## 2016-02-17 MED ORDER — LIDOCAINE HCL (PF) 1 % IJ SOLN
INTRAMUSCULAR | Status: AC
Start: 2016-02-17 — End: 2016-02-17
  Administered 2016-02-17: 30 mL
  Filled 2016-02-17: qty 30

## 2016-02-17 MED ORDER — LIDOCAINE HCL (PF) 1 % IJ SOLN
30.0000 mL | Freq: Once | INTRAMUSCULAR | Status: AC
  Administered 2016-02-17: 30 mL

## 2016-02-17 NOTE — ED Triage Notes (Signed)
Pt c/o R shoulder abscess x 5 months increasing x 1 week.  Pain score 8/10.  Pt reports "it was the size of a pea until recently."  Pt is [redacted] weeks pregnant.  Denies any pregnancy related complaints.

## 2016-02-17 NOTE — ED Notes (Signed)
Dr.Yelverton at bedside to perform I&D

## 2016-02-17 NOTE — ED Notes (Signed)
All entries made by RB are entered on the wrong chart in error.

## 2016-02-17 NOTE — ED Provider Notes (Signed)
[facility]-EMERGENCY DEPT Provider Note   CSN: [id number] Arrival date & time: 02/17/16  1630     History   Chief Complaint Chief Complaint  Patient presents with  . Abscess  . [redacted] weeks pregnant    HPI Taylor Harding is a 26 y.o. female.  HPI Patient presents with mass to the right shoulder which is been present for the past 5 months. States it was roughly the size of an eraser head until about a week ago. Has enlarged and become tender. No erythema or warmth. No fever or chills. No previous history of abscess. Patient is [redacted] weeks pregnant. Denies any abdominal pain, vaginal bleeding or discharge. Is followed by OB/GYN. Past Medical History:  Diagnosis Date  . Asthma     Patient Active Problem List   Diagnosis Date Noted  . Sebaceous cyst 02/12/2016  . Supervision of high risk pregnancy, antepartum 12/05/2015  . Late prenatal care affecting pregnancy in second trimester 12/05/2015  . H/O miscarriage, currently pregnant, second trimester 12/05/2015    Past Surgical History:  Procedure Laterality Date  . NO PAST SURGERIES      OB History    Gravida Para Term Preterm AB Living   3 1 1   1 1    SAB TAB Ectopic Multiple Live Births   1       1       Home Medications    Prior to Admission medications   Medication Sig Start Date End Date Taking? Authorizing Provider  albuterol (PROVENTIL HFA;VENTOLIN HFA) 108 (90 Base) MCG/ACT inhaler Inhale 2 puffs into the lungs every 6 (six) hours as needed for wheezing or shortness of breath.   Yes Historical Provider, MD  Prenatal Vit-Fe Fumarate-FA (MULTIVITAMIN-PRENATAL) 27-0.8 MG TABS tablet Take 1 tablet by mouth daily at 12 noon.   Yes Historical Provider, MD  cephALEXin (KEFLEX) 500 MG capsule Take 1 capsule (500 mg total) by mouth 3 (three) times daily. 02/12/16   Hermina Staggers, MD  famotidine (PEPCID) 20 MG tablet Take 1 tablet (20 mg total) by mouth 2 (two) times daily. Patient not taking: Reported on 01/26/2016 11/12/15    Renne Crigler, PA-C  Prenat-FeCbn-FeAspGl-FA-Omega (OB COMPLETE PETITE) 35-5-1-200 MG CAPS Take 1 tablet by mouth daily. Patient not taking: Reported on 02/12/2016 12/05/15   Roe Coombs, CNM    Family History History reviewed. No pertinent family history.  Social History Social History  Substance Use Topics  . Smoking status: Never Smoker  . Smokeless tobacco: Never Used  . Alcohol use No     Allergies   Benadryl [diphenhydramine hcl (sleep)] and Latex   Review of Systems Review of Systems  Constitutional: Negative for chills and fever.  Respiratory: Negative for shortness of breath.   Cardiovascular: Negative for chest pain.  Gastrointestinal: Negative for abdominal pain, diarrhea and vomiting.  Musculoskeletal: Negative for arthralgias, back pain, myalgias and neck pain.  Neurological: Negative for dizziness, weakness, light-headedness, numbness and headaches.  All other systems reviewed and are negative.    Physical Exam Updated Vital Signs BP 125/63 (BP Location: Left Arm)   Pulse 67   Temp 97.9 F (36.6 C) (Oral)   Resp 17   Ht 5\' 5"  (1.651 m)   Wt 148 lb (67.1 kg)   LMP 08/06/2015 (Exact Date) Comment: double shielded, spoke to MD to verify if xray needed.  SpO2 98%   BMI 24.63 kg/m   Physical Exam  Constitutional: She is oriented to person, place, and time. She  appears well-developed and well-nourished. No distress.  HENT:  Head: Normocephalic and atraumatic.  Mouth/Throat: Oropharynx is clear and moist.  Eyes: EOM are normal. Pupils are equal, round, and reactive to light.  Neck: Normal range of motion. Neck supple.  Cardiovascular: Normal rate and regular rhythm.   Pulmonary/Chest: Effort normal and breath sounds normal.  Abdominal: Soft. Bowel sounds are normal. There is no tenderness. There is no rebound and no guarding.  Gravid abdomen  Musculoskeletal: Normal range of motion. She exhibits no edema or tenderness.  Neurological: She is alert  and oriented to person, place, and time.  Skin: Skin is warm and dry. No rash noted. No erythema.  Patient has round mobile mass in the subcutaneous tissue over the right trapezius. No definite fluctuance fluctuance. Very mild tenderness to palpation. No warmth or erythema. Patient does have mild hyperpigmentation of the skin overlying the mass.  Psychiatric: She has a normal mood and affect. Her behavior is normal.  Nursing note and vitals reviewed.    ED Treatments / Results  Labs (all labs ordered are listed, but only abnormal results are displayed) Labs Reviewed - No data to display  EKG  EKG Interpretation None       Radiology No results found.  Procedures .Marland Kitchen.Incision and Drainage Date/Time: 02/17/2016 9:28 PM Performed by: Loren RacerYELVERTON, Garik Diamant Authorized by: Ranae PalmsYELVERTON, Detron Carras   Consent:    Consent obtained:  Verbal Location:    Type:  Abscess   Size:  3   Location:  Trunk   Trunk location:  Back Pre-procedure details:    Skin preparation:  Chloraprep Anesthesia (see MAR for exact dosages):    Anesthesia method:  Local infiltration   Local anesthetic:  Lidocaine 1% w/o epi Procedure type:    Complexity:  Simple Procedure details:    Needle aspiration: yes     Needle size:  20 G   Incision types:  Single straight   Incision depth:  Dermal   Scalpel blade:  11   Wound management:  Probed and deloculated   Drainage:  Purulent   Drainage amount:  Moderate   Wound treatment:  Wound left open   Packing materials:  None Post-procedure details:    Patient tolerance of procedure:  Tolerated well, no immediate complications   (including critical care time)  Medications Ordered in ED Medications  lidocaine (PF) (XYLOCAINE) 1 % injection 30 mL (30 mLs Infiltration Given 02/17/16 2058)     Initial Impression / Assessment and Plan / ED Course  I have reviewed the triage vital signs and the nursing notes.  Pertinent labs & imaging results that were available during  my care of the patient were reviewed by me and considered in my medical decision making (see chart for details).    ID with moderate amount of purulent material. Patient advised to follow-up with her primary doctor or emergency department.   Final Clinical Impressions(s) / ED Diagnoses   Final diagnoses:  Abscess    New Prescriptions New Prescriptions   No medications on file     Loren Raceravid Keyonda Bickle, MD 02/17/16 2129

## 2016-02-17 NOTE — ED Notes (Signed)
Pt reports understanding of discharge information. No questions at time of discharge 

## 2016-02-19 ENCOUNTER — Emergency Department (HOSPITAL_COMMUNITY): Payer: Medicaid Other

## 2016-02-19 ENCOUNTER — Emergency Department (HOSPITAL_COMMUNITY)
Admission: EM | Admit: 2016-02-19 | Discharge: 2016-02-19 | Disposition: A | Discharge status: Home / Self Care | Payer: Medicaid Other | Attending: Emergency Medicine | Admitting: Emergency Medicine

## 2016-02-19 ENCOUNTER — Encounter (HOSPITAL_COMMUNITY): Payer: Self-pay | Admitting: Emergency Medicine

## 2016-02-19 DIAGNOSIS — J45909 Unspecified asthma, uncomplicated: Secondary | ICD-10-CM | POA: Insufficient documentation

## 2016-02-19 DIAGNOSIS — R0789 Other chest pain: Secondary | ICD-10-CM | POA: Diagnosis not present

## 2016-02-19 DIAGNOSIS — Z91040 Latex allergy status: Secondary | ICD-10-CM | POA: Diagnosis not present

## 2016-02-19 DIAGNOSIS — O9989 Other specified diseases and conditions complicating pregnancy, childbirth and the puerperium: Secondary | ICD-10-CM | POA: Diagnosis present

## 2016-02-19 DIAGNOSIS — Z79899 Other long term (current) drug therapy: Secondary | ICD-10-CM | POA: Insufficient documentation

## 2016-02-19 DIAGNOSIS — Z3A28 28 weeks gestation of pregnancy: Secondary | ICD-10-CM | POA: Insufficient documentation

## 2016-02-19 DIAGNOSIS — R079 Chest pain, unspecified: Secondary | ICD-10-CM

## 2016-02-19 LAB — CBC
HEMATOCRIT: 33.5 % — AB (ref 36.0–46.0)
HEMOGLOBIN: 11.3 g/dL — AB (ref 12.0–15.0)
MCH: 30.5 pg (ref 26.0–34.0)
MCHC: 33.7 g/dL (ref 30.0–36.0)
MCV: 90.5 fL (ref 78.0–100.0)
Platelets: 195 10*3/uL (ref 150–400)
RBC: 3.7 MIL/uL — ABNORMAL LOW (ref 3.87–5.11)
RDW: 12.1 % (ref 11.5–15.5)
WBC: 11.4 10*3/uL — ABNORMAL HIGH (ref 4.0–10.5)

## 2016-02-19 LAB — BASIC METABOLIC PANEL
ANION GAP: 9 (ref 5–15)
BUN: 11 mg/dL (ref 6–20)
CO2: 20 mmol/L — ABNORMAL LOW (ref 22–32)
Calcium: 8.9 mg/dL (ref 8.9–10.3)
Chloride: 106 mmol/L (ref 101–111)
Creatinine, Ser: 0.42 mg/dL — ABNORMAL LOW (ref 0.44–1.00)
GFR calc Af Amer: >60 mL/min (ref >60)
GFR calc non Af Amer: >60 mL/min (ref >60)
GLUCOSE: 128 mg/dL — AB (ref 65–99)
POTASSIUM: 3.7 mmol/L (ref 3.5–5.1)
Sodium: 135 mmol/L (ref 135–145)

## 2016-02-19 LAB — I-STAT TROPONIN, ED: Troponin i, poc: 0 ng/mL (ref 0.00–0.08)

## 2016-02-19 MED ORDER — ALUM & MAG HYDROXIDE-SIMETH 200-200-20 MG/5ML PO SUSP
30.0000 mL | Freq: Once | ORAL | Status: AC
  Administered 2016-02-19: 30 mL via ORAL
  Filled 2016-02-19: qty 30

## 2016-02-19 MED ORDER — TECHNETIUM TO 99M ALBUMIN AGGREGATED
1.5500 | Freq: Once | INTRAVENOUS | Status: AC | PRN
  Administered 2016-02-19: 1.55 via INTRAVENOUS

## 2016-02-19 MED ORDER — ACETAMINOPHEN 325 MG PO TABS
650.0000 mg | ORAL_TABLET | Freq: Once | ORAL | Status: AC
  Administered 2016-02-19: 650 mg via ORAL
  Filled 2016-02-19: qty 2

## 2016-02-19 NOTE — Discharge Instructions (Addendum)
Take acetaminophen (Tylenol) up to 975 mg (this is normally 3 over-the-counter pills) up to 3 times a day. Do not drink alcohol. Make sure your other medications do not contain acetaminophen (Read the labels!)   Tests performed today include: An EKG of your heart A chest x-ray Cardiac enzymes - a blood test for heart muscle damage Blood counts and electrolytes Vital signs. See below for your results today.   Medications prescribed:   Take any prescribed medications only as directed.  Follow-up instructions: Please follow-up with your primary care provider as soon as you can for further evaluation of your symptoms.   Return instructions:  SEEK IMMEDIATE MEDICAL ATTENTION IF: You have severe chest pain, especially if the pain is crushing or pressure-like and spreads to the arms, back, neck, or jaw, or if you have sweating, nausea (feeling sick to your stomach), or shortness of breath. THIS IS AN EMERGENCY. Don't wait to see if the pain will go away. Get medical help at once. Call 911 or 0 (operator). DO NOT drive yourself to the hospital.  Your chest pain gets worse and does not go away with rest.  You have an attack of chest pain lasting longer than usual, despite rest and treatment with the medications your caregiver has prescribed.  You wake from sleep with chest pain or shortness of breath. You feel dizzy or faint. You have chest pain not typical of your usual pain for which you originally saw your caregiver.  You have any other emergent concerns regarding your health.  Additional Information: Chest pain comes from many different causes. Your caregiver has diagnosed you as having chest pain that is not specific for one problem, but does not require admission.  You are at low risk for an acute heart condition or other serious illness.   Your vital signs today were: BP 123/71 (BP Location: Left Arm)    Pulse 97    Temp 98.6 F (37 C) (Oral)    Resp 18    LMP 08/06/2015 (Exact Date)  Comment: double shielded, spoke to MD to verify if xray needed.   SpO2 100%  If your blood pressure (BP) was elevated above 135/85 this visit, please have this repeated by your doctor within one month. --------------

## 2016-02-19 NOTE — ED Notes (Signed)
Patient transported to [facility] 

## 2016-02-19 NOTE — ED Triage Notes (Addendum)
Pt complaint of constant central chest pressure since yesterday; denies cough or other symptoms. Unrelieved by inhaler. Lung sounds clear. Pt denies cough or other recent illness. Pt is [redacted] weeks pregnant; denies abdominal pain or vaginal bleeding. OB Rapid Response called and verbalizes call [phone] when pt is in a regular room. Per Lynelle DoctorKnapp hold DG order until assessed by MD.

## 2016-02-19 NOTE — ED Notes (Signed)
OB Rapid Response aware pt in ROOM 3.

## 2016-02-19 NOTE — Progress Notes (Signed)
Pt is a G3P1 at 281/[redacted] weeks gestation here with c/o chest pain. The pain radiate anywhere, but is in the middle of her chest. No vomiting or diaphoresis. [facility]. No vaginal bleeding or leaking of fluid. Previous vaginal delivery. V/S are normal. EKG normal. Pt appears to be in no distress.

## 2016-02-19 NOTE — ED Provider Notes (Signed)
PROGRESS NOTE                                                                                                                 This is a sign-out from PA Mohr at shift change: Taylor Harding is a 26 y.o. female presenting with chest pain, [redacted] weeks pregnant. Workup reassuring however PE is considered, VQ scan pending. Please refer to previous note for full HPI, ROS, PMH and PE.   Perfusion scan normal. Discussed results with patient, recommend Tylenol at home for pain relief and close follow-up with OB/GYN.      Taylor Emeryicole Gedalia Mcmillon, PA-C 02/19/16 1751    Maia PlanJoshua G Long, MD 02/20/16 1047

## 2016-02-19 NOTE — ED Provider Notes (Signed)
[facility]-EMERGENCY DEPT Provider Note   CSN: [id number] Arrival date & time: 02/19/16  1222  History   Chief Complaint Chief Complaint  Patient presents with  . Chest Pain    HPI Taylor Harding is a 26 y.o. female.  HPI  26 y.o. female G3P1011 at 28wk with a hx of Asthma, presents to the Emergency Department today complaining of central chest pressure since yesterday. Notes occurring while lying on the couch at rest. States pain is constant and centrally located. No radiation. No diaphoresis. Denies cough/congestion. Notes minimal relief with inhaler. Does not endorse abdominal pain or vaginal bleeding. Mild N/V, but this is normal with her pregnancy. Does endorse hx of same with chest pressure 6 months ago with unremarkable work up. Told it was related to her heart murmur. No hx DVT/PE/ No recent travel. No other symptoms noted.   Prenatal Care- [facility]  Past Medical History:  Diagnosis Date  . Asthma     Patient Active Problem List   Diagnosis Date Noted  . Sebaceous cyst 02/12/2016  . Supervision of high risk pregnancy, antepartum 12/05/2015  . Late prenatal care affecting pregnancy in second trimester 12/05/2015  . H/O miscarriage, currently pregnant, second trimester 12/05/2015    Past Surgical History:  Procedure Laterality Date  . NO PAST SURGERIES      OB History    Gravida Para Term Preterm AB Living   3 1 1   1 1    SAB TAB Ectopic Multiple Live Births   1       1       Home Medications    Prior to Admission medications   Medication Sig Start Date End Date Taking? Authorizing Provider  albuterol (PROVENTIL HFA;VENTOLIN HFA) 108 (90 Base) MCG/ACT inhaler Inhale 2 puffs into the lungs every 6 (six) hours as needed for wheezing or shortness of breath.    Historical Provider, MD  cephALEXin (KEFLEX) 500 MG capsule Take 1 capsule (500 mg total) by mouth 3 (three) times daily. 02/12/16   Hermina StaggersMichael L Ervin, MD  famotidine (PEPCID) 20 MG tablet Take 1 tablet (20 mg  total) by mouth 2 (two) times daily. Patient not taking: Reported on 01/26/2016 11/12/15   Renne CriglerJoshua Geiple, PA-C  Prenat-FeCbn-FeAspGl-FA-Omega (OB COMPLETE PETITE) 35-5-1-200 MG CAPS Take 1 tablet by mouth daily. Patient not taking: Reported on 02/12/2016 12/05/15   Roe Coombsachelle A Denney, CNM  Prenatal Vit-Fe Fumarate-FA (MULTIVITAMIN-PRENATAL) 27-0.8 MG TABS tablet Take 1 tablet by mouth daily at 12 noon.    Historical Provider, MD    Family History No family history on file.  Social History Social History  Substance Use Topics  . Smoking status: Never Smoker  . Smokeless tobacco: Never Used  . Alcohol use No     Allergies   Benadryl [diphenhydramine hcl (sleep)] and Latex   Review of Systems Review of Systems ROS reviewed and all are negative for acute change except as noted in the HPI.  Physical Exam Updated Vital Signs BP 123/71 (BP Location: Left Arm)   Pulse 97   Temp 98.6 F (37 C) (Oral)   Resp 18   LMP 08/06/2015 (Exact Date) Comment: double shielded, spoke to MD to verify if xray needed.  SpO2 100%   Physical Exam  Constitutional: She is oriented to person, place, and time. Vital signs are normal. She appears well-developed and well-nourished. No distress.  HENT:  Head: Normocephalic and atraumatic.  Right Ear: Hearing, tympanic membrane, external ear and ear canal normal.  Left Ear:  Hearing, tympanic membrane, external ear and ear canal normal.  Nose: Nose normal.  Mouth/Throat: Uvula is midline, oropharynx is clear and moist and mucous membranes are normal. No trismus in the jaw. No oropharyngeal exudate, posterior oropharyngeal erythema or tonsillar abscesses.  Eyes: Conjunctivae and EOM are normal. Pupils are equal, round, and reactive to light.  Neck: Trachea normal and normal range of motion. Neck supple. No tracheal deviation present.  Cardiovascular: Normal rate, regular rhythm, S1 normal, S2 normal, normal heart sounds, intact distal pulses and normal pulses.    Pulmonary/Chest: Effort normal and breath sounds normal. No respiratory distress. She has no decreased breath sounds. She has no wheezes. She has no rhonchi. She has no rales.  Abdominal: Normal appearance and bowel sounds are normal. There is no tenderness. There is no rigidity and no guarding.  Musculoskeletal: Normal range of motion.  Neurological: She is alert and oriented to person, place, and time. She has normal strength. No cranial nerve deficit or sensory deficit.  Skin: Skin is warm and dry.  Psychiatric: She has a normal mood and affect. Her speech is normal and behavior is normal. Thought content normal.  Nursing note and vitals reviewed.  ED Treatments / Results  Labs (all labs ordered are listed, but only abnormal results are displayed) Labs Reviewed  BASIC METABOLIC PANEL - Abnormal; Notable for the following:       Result Value   CO2 20 (*)    Glucose, Bld 128 (*)    Creatinine, Ser 0.42 (*)    All other components within normal limits  CBC - Abnormal; Notable for the following:    WBC 11.4 (*)    RBC 3.70 (*)    Hemoglobin 11.3 (*)    HCT 33.5 (*)    All other components within normal limits  I-STAT TROPOININ, ED    EKG  EKG Interpretation None       Radiology No results found.  Procedures Procedures (including critical care time)  Medications Ordered in ED Medications  alum & mag hydroxide-simeth (MAALOX/MYLANTA) 200-200-20 MG/5ML suspension 30 mL (30 mLs Oral Given 02/19/16 1404)   Initial Impression / Assessment and Plan / ED Course  I have reviewed the triage vital signs and the nursing notes.  Pertinent labs & imaging results that were available during my care of the patient were reviewed by me and considered in my medical decision making (see chart for details).    Final Clinical Impressions(s) / ED Diagnoses  {I have reviewed and evaluated the relevant laboratory values. {I have reviewed and evaluated the relevant imaging studies. {I have  interpreted the relevant EKG. {I have reviewed the relevant previous healthcare records. {I have reviewed EMS Documentation. {I obtained HPI from historian. {Patient discussed with supervising physician.  ED Course:  Assessment: Pt is a 25yF G3P1011 at 28wk with a hx of Asthma presents with CP with onset yesterday while at rest. Constant. Hx same x 6 months ago with unremarkable work up. No relief with inhaler. Rapid OB responded. Denies Abdominal pain. No vaginal bleeding. Given maalox in ED with minimal relief. Patient is to be discharged with recommendation to follow up with PCP in regards to today's hospital visit. Chest pain is not likely of cardiac or pulmonary etiology d/t presentation, perc negative, VSS, no tracheal deviation, no JVD or new murmur, RRR, breath sounds equal bilaterally, EKG without acute abnormalities, negative troponin.  Due to symptoms ongoing despite tylenol and GI cocktail, will order VQ scan to eval for  potential pulmonary embolism, If unremarkable, likely either musculoskeletal pain vs gastric reflux.   Disposition/Plan:  Pending VQ Sign out to : Wynetta Emery, PA-C Pt acknowledges and agrees with plan  Supervising Physician Maia Plan, MD  Final diagnoses:  Chest pain  Chest pain, unspecified type    New Prescriptions New Prescriptions   No medications on file     Audry Pili, PA-C 02/19/16 1543    Maia Plan, MD 02/20/16 1047

## 2016-02-19 NOTE — Progress Notes (Signed)
Spoke with Dr. Debroah LoopArnold. Pt is a G3P1 at 28 1/[redacted] weeks gestation here with c/o chest pain. No bleeding or leaking of fluid. FHR tracing intermittent at times, much fetal movement. Baseline 145 BPM, min-mod variability, 10x10 accels, 1 variable decel with a uc. UI in between. No c/o cramping, abd pain, or back pain.EKG is normal, labs are normal. Pt given Maalox. Says she ate spagetti, salad, an apple, and powdered doughnuts for lunch. Okay for [facility] to sign off.ED staff notified.

## 2016-02-21 ENCOUNTER — Encounter (HOSPITAL_COMMUNITY): Payer: Self-pay

## 2016-02-21 ENCOUNTER — Inpatient Hospital Stay (HOSPITAL_COMMUNITY): Payer: Medicaid Other

## 2016-02-21 ENCOUNTER — Inpatient Hospital Stay (HOSPITAL_COMMUNITY)
Admission: AD | Admit: 2016-02-21 | Discharge: 2016-02-21 | Disposition: A | Discharge status: Home / Self Care | Payer: Medicaid Other | Source: Ambulatory Visit | Attending: Obstetrics & Gynecology | Admitting: Obstetrics & Gynecology

## 2016-02-21 DIAGNOSIS — N949 Unspecified condition associated with female genital organs and menstrual cycle: Secondary | ICD-10-CM

## 2016-02-21 DIAGNOSIS — R102 Pelvic and perineal pain: Secondary | ICD-10-CM | POA: Insufficient documentation

## 2016-02-21 DIAGNOSIS — Z3A28 28 weeks gestation of pregnancy: Secondary | ICD-10-CM | POA: Insufficient documentation

## 2016-02-21 DIAGNOSIS — R109 Unspecified abdominal pain: Secondary | ICD-10-CM | POA: Diagnosis not present

## 2016-02-21 DIAGNOSIS — O26893 Other specified pregnancy related conditions, third trimester: Secondary | ICD-10-CM

## 2016-02-21 DIAGNOSIS — O368130 Decreased fetal movements, third trimester, not applicable or unspecified: Secondary | ICD-10-CM | POA: Diagnosis present

## 2016-02-21 LAB — URINALYSIS, ROUTINE W REFLEX MICROSCOPIC
Bacteria, UA: NONE SEEN
Bilirubin Urine: NEGATIVE
GLUCOSE, UA: NEGATIVE mg/dL
Hgb urine dipstick: NEGATIVE
Ketones, ur: NEGATIVE mg/dL
NITRITE: NEGATIVE
PROTEIN: NEGATIVE mg/dL
SPECIFIC GRAVITY, URINE: 1.025 (ref 1.005–1.030)
pH: 8 (ref 5.0–8.0)

## 2016-02-21 LAB — AMNISURE RUPTURE OF MEMBRANE (ROM) NOT AT ARMC: AMNISURE: NEGATIVE

## 2016-02-21 LAB — OB RESULTS CONSOLE GBS: STREP GROUP B AG: NEGATIVE

## 2016-02-21 NOTE — MAU Note (Signed)
Taken directly back to rm

## 2016-02-21 NOTE — MAU Provider Note (Signed)
History     CSN: [id number]  Arrival date and time: 02/21/16 1629   First Provider Initiated Contact with Patient 02/21/16 1704      Chief Complaint  Patient presents with  . Abdominal Cramping  . Decreased Fetal Movement   HPI   Taylor Harding is a 26 y.o. female G3P1011 @ 8773w3d here in MAU with abdominal pain and decreased fetal movement. The pain started this morning around 0500. The pain feels like a constant cramping in her lower abdomen. The pain radiates to her lower back. She denies vaginal bleeding, however she did notice some clear fluid today.   She Lost twins at 18 weeks; [place] and [place] babies were without a heartbeat. She had gone to the hospital and was in labor. Her first child was carried until 37 weeks.   + fetal movement since her arrival to MAU.   OB History    Gravida Para Term Preterm AB Living   3 1 1   1 1    SAB TAB Ectopic Multiple Live Births   1       1      Past Medical History:  Diagnosis Date  . Asthma     Past Surgical History:  Procedure Laterality Date  . NO PAST SURGERIES      History reviewed. No pertinent family history.  Social History  Substance Use Topics  . Smoking status: Never Smoker  . Smokeless tobacco: Never Used  . Alcohol use No    Allergies:  Allergies  Allergen Reactions  . Benadryl [Diphenhydramine Hcl (Sleep)] Anaphylaxis  . Latex Anaphylaxis    Prescriptions Prior to Admission  Medication Sig Dispense Refill Last Dose  . albuterol (PROVENTIL HFA;VENTOLIN HFA) 108 (90 Base) MCG/ACT inhaler Inhale 2 puffs into the lungs every 6 (six) hours as needed for wheezing or shortness of breath.   Past Week at Unknown time  . Prenat-FeCbn-FeAspGl-FA-Omega (OB COMPLETE PETITE) 35-5-1-200 MG CAPS Take 1 tablet by mouth daily. 30 capsule 12 02/21/2016 at Unknown time  . cephALEXin (KEFLEX) 500 MG capsule Take 1 capsule (500 mg total) by mouth 3 (three) times daily. (Patient not taking:  Reported on 02/19/2016) 21 capsule 0 Not Taking at Unknown time  . famotidine (PEPCID) 20 MG tablet Take 1 tablet (20 mg total) by mouth 2 (two) times daily. (Patient not taking: Reported on 01/26/2016) 15 tablet 0 Not Taking at Unknown time   Results for orders placed or performed during the hospital encounter of 02/21/16 (from the past 48 hour(s))  Urinalysis, Routine w reflex microscopic     Status: Abnormal   Collection Time: 02/21/16  4:36 PM  Result Value Ref Range   Color, Urine YELLOW YELLOW   APPearance HAZY (A) CLEAR   Specific Gravity, Urine 1.025 1.005 - 1.030   pH 8.0 5.0 - 8.0   Glucose, UA NEGATIVE NEGATIVE mg/dL   Hgb urine dipstick NEGATIVE NEGATIVE   Bilirubin Urine NEGATIVE NEGATIVE   Ketones, ur NEGATIVE NEGATIVE mg/dL   Protein, ur NEGATIVE NEGATIVE mg/dL   Nitrite NEGATIVE NEGATIVE   Leukocytes, UA TRACE (A) NEGATIVE   RBC / HPF 0-5 0 - 5 RBC/hpf   WBC, UA 0-5 0 - 5 WBC/hpf   Bacteria, UA NONE SEEN NONE SEEN   Squamous Epithelial / LPF 6-30 (A) NONE SEEN   Mucous PRESENT   Amnisure rupture of membrane (rom)not at [facility]     Status: None   Collection Time: 02/21/16  5:20  PM  Result Value Ref Range   Amnisure ROM NEGATIVE    Review of Systems  Gastrointestinal: Positive for abdominal pain.  Genitourinary: Negative for dysuria.   Physical Exam   Blood pressure 121/70, pulse 66, temperature 98.3 F (36.8 C), resp. rate 16, last menstrual period 08/06/2015, SpO2 99 %.  Physical Exam  Constitutional: She is oriented to person, place, and time. She appears well-developed and well-nourished. No distress.  HENT:  Head: Normocephalic.  Eyes: Pupils are equal, round, and reactive to light.  Neck: Neck supple.  GI: Soft. She exhibits no distension. There is no tenderness. There is no rebound and no guarding.  Genitourinary:  Genitourinary Comments: Vagina - Small amount of white vaginal discharge, no odor, no pooling of fluid  Cervix - No contact bleeding, no active  bleeding  Bimanual exam: Cervix closed, posterior  Chaperone present for exam.   Neurological: She is alert and oriented to person, place, and time.  Skin: Skin is warm. She is not diaphoretic.  Psychiatric: Her behavior is normal.   Fetal Tracing: Baseline: 120 bpm  Variability: Moderate  Accelerations: 15 x 15 Decelerations: quick variables  Toco: None  MAU Course  Procedures  None  MDM  Amnisure negative  [place] to evaluate cervical length.  Cervical length 3.9  Assessment and Plan   A:  1. Round ligament pain   2. Abdominal pain in pregnancy, third trimester     P:  Discharge home in stable condition Urine culture negative Keep your next scheduled appointment with OB.  Return to MAU if symptoms worsen   Duane Lope, NP 02/21/2016 7:34 PM

## 2016-02-21 NOTE — Discharge Instructions (Signed)
Abdominal Pain During Pregnancy Belly (abdominal) pain is common during pregnancy. Most of the time, it is not a serious problem. Other times, it can be a sign that something is wrong with the pregnancy. Always tell your doctor if you have belly pain. Follow these instructions at home: Monitor your belly pain for any changes. The following actions may help you feel better:  Do not have sex (intercourse) or put anything in your vagina until you feel better.  Rest until your pain stops.  Drink clear fluids if you feel sick to your stomach (nauseous). Do not eat solid food until you feel better.  Only take medicine as told by your doctor.  Keep all doctor visits as told. Get help right away if:  You are bleeding, leaking fluid, or pieces of tissue come out of your vagina.  You have more pain or cramping.  You keep throwing up (vomiting).  You have pain when you pee (urinate) or have blood in your pee.  You have a fever.  You do not feel your baby moving as much.  You feel very weak or feel like passing out.  You have trouble breathing, with or without belly pain.  You have a very bad headache and belly pain.  You have fluid leaking from your vagina and belly pain.  You keep having watery poop (diarrhea).  Your belly pain does not go away after resting, or the pain gets worse. This information is not intended to replace advice given to you by your health care provider. Make sure you discuss any questions you have with your health care provider. Document Released: 12/26/2008 Document Revised: 08/16/2015 Document Reviewed: 08/06/2012 Elsevier Interactive Patient Education  2017 [employer] Inc[employer] Nonstress Test The nonstress test is a procedure that monitors the fetus's heartbeat. The test will monitor the heartbeat when the fetus is at rest and while the fetus is moving. In a healthy fetus, there will be an increase in fetal heart rate when the fetus moves or kicks. The heart  rate will decrease at rest. This test helps determine if the fetus is healthy. Your health care provider will look at a number of patterns in the heart rate tracing to make sure your baby is thriving. If there is concern, your health care provider may order additional tests or may suggest another course of action. This test is often done in the third trimester and can help determine if an early delivery is needed and safe. Common reasons to have this test are:  You are past your due date.  You have a high-risk pregnancy.  You are feeling less movement than normal.  You have lost a pregnancy in the past.  Your health care provider suspects fetal growth problems.  You have too much or too little amniotic fluid. BEFORE THE PROCEDURE  Eat a meal right before the test or as directed by your health care provider. Food may help stimulate fetal movements.  Use the restroom right before the test. PROCEDURE  Two belts will be placed around your abdomen. These belts have monitors attached to them. One records the fetal heart rate and the other records uterine contractions.  You may be asked to lie down on your side or to stay sitting upright.  You may be given a button to press when you feel movement.  The fetal heartbeat is listened to and watched on a screen. The heartbeat is recorded on a sheet of paper.  If the fetus seems to be  sleeping, you may be asked to drink some juice or soda, gently press your abdomen, or make some noise to wake the fetus. AFTER THE PROCEDURE  Your health care provider will discuss the test results with you and make recommendations for the near future. This information is not intended to replace advice given to you by your health care provider. Make sure you discuss any questions you have with your health care provider. Document Released: 12/28/2001 Document Revised: 01/28/2014 Document Reviewed: 02/11/2012 Elsevier Interactive Patient Education  2017 [employer]

## 2016-02-21 NOTE — MAU Note (Signed)
Pt has not felt baby move since last night. Has drank water, coffee and ate food to see if that would help but no movement. Had breathing issues earlier in the week and had a work up for that. No bleeding, feels like she had some watery discharge with an odor.

## 2016-02-22 ENCOUNTER — Other Ambulatory Visit: Payer: Self-pay | Admitting: *Deleted

## 2016-02-22 DIAGNOSIS — O0990 Supervision of high risk pregnancy, unspecified, unspecified trimester: Secondary | ICD-10-CM

## 2016-02-22 DIAGNOSIS — O24410 Gestational diabetes mellitus in pregnancy, diet controlled: Secondary | ICD-10-CM

## 2016-02-22 MED ORDER — ACCU-CHEK FASTCLIX LANCETS MISC: 5 refills | Status: DC

## 2016-02-22 MED ORDER — ACCU-CHEK GUIDE W/DEVICE KIT: 1.0000 | PACK | Freq: Once | 0 refills | Status: AC

## 2016-02-22 MED ORDER — GLUCOSE BLOOD VI STRP: ORAL_STRIP | 5 refills | Status: DC

## 2016-02-22 NOTE — Progress Notes (Signed)
Diabetic supplies sent to pharmacy

## 2016-02-23 LAB — CULTURE, OB URINE
Culture: <10000 — AB
SPECIAL REQUESTS: NORMAL

## 2016-02-27 ENCOUNTER — Encounter: Payer: Medicaid Other | Admitting: Obstetrics and Gynecology

## 2016-03-04 ENCOUNTER — Ambulatory Visit (INDEPENDENT_AMBULATORY_CARE_PROVIDER_SITE_OTHER): Payer: Medicaid Other | Admitting: Certified Nurse Midwife

## 2016-03-04 VITALS — BP 112/70 | HR 88 | Wt 151.0 lb

## 2016-03-04 DIAGNOSIS — O09292 Supervision of pregnancy with other poor reproductive or obstetric history, second trimester: Secondary | ICD-10-CM

## 2016-03-04 DIAGNOSIS — O0932 Supervision of pregnancy with insufficient antenatal care, second trimester: Secondary | ICD-10-CM

## 2016-03-04 DIAGNOSIS — O0990 Supervision of high risk pregnancy, unspecified, unspecified trimester: Secondary | ICD-10-CM

## 2016-03-04 DIAGNOSIS — O24419 Gestational diabetes mellitus in pregnancy, unspecified control: Secondary | ICD-10-CM

## 2016-03-04 NOTE — Progress Notes (Signed)
Please review glucose reading with pt.

## 2016-03-04 NOTE — Progress Notes (Signed)
   PRENATAL VISIT NOTE  Subjective:  Taylor Harding is a 26 y.o. G3P1011 at 2421w1d being seen today for ongoing prenatal care.  She is currently monitored for the following issues for this high-risk pregnancy and has Supervision of high risk pregnancy, antepartum; Late prenatal care affecting pregnancy in second trimester; H/O miscarriage, currently pregnant, second trimester; Sebaceous cyst; and GDM (gestational diabetes mellitus) on her problem list.  Patient reports no complaints.  Contractions: Not present. Vag. Bleeding: None.  Movement: Present. Denies leaking of fluid.   The following portions of the patient's history were reviewed and updated as appropriate: allergies, current medications, past family history, past medical history, past social history, past surgical history and problem list. Problem list updated.  Objective:   Vitals:   03/04/16 1029  BP: 112/70  Pulse: 88  Weight: 151 lb (68.5 kg)    Fetal Status: Fetal Heart Rate (bpm): 150 Fundal Height: 29 cm Movement: Present     General:  Alert, oriented and cooperative. Patient is in no acute distress.  Skin: Skin is warm and dry. No rash noted.   Cardiovascular: Normal heart rate noted  Respiratory: Normal respiratory effort, no problems with respiration noted  Abdomen: Soft, gravid, appropriate for gestational age. Pain/Pressure: Absent     Pelvic:  Cervical exam deferred        Extremities: Normal range of motion.     Mental Status: Normal mood and affect. Normal behavior. Normal judgment and thought content.   Assessment and Plan:  Pregnancy: G3P1011 at 6921w1d  1. Supervision of high risk pregnancy, antepartum      GDM - AMB referral to maternal fetal medicine  2. Late prenatal care affecting pregnancy in second trimester      3. H/O miscarriage, currently pregnant, second trimester     @18  weeks.    4. Gestational diabetes mellitus (GDM) in third trimester, gestational diabetes method of control  unspecified     Has meter, test strips and lancets.  Has tried to take her CBGs, Has not had DM teaching.       Scheduled for growth US on 03/13/16 - AMB referral to maternal fetal medicine  Preterm labor symptoms and general obstetric precautions including but not limited to vaginal bleeding, contractions, leaking of fluid and fetal movement were reviewed in detail with the patient. Please refer to After Visit Summary for other counseling recommendations.  Return in about 2 weeks (around 03/18/2016) [facility], [facility].   Roe Coombsachelle A Denney, CNM

## 2016-03-07 ENCOUNTER — Encounter (HOSPITAL_COMMUNITY): Payer: Self-pay | Admitting: Certified Nurse Midwife

## 2016-03-11 ENCOUNTER — Encounter: Payer: Self-pay | Admitting: *Deleted

## 2016-03-11 ENCOUNTER — Encounter: Payer: Medicaid Other | Attending: Certified Nurse Midwife | Admitting: *Deleted

## 2016-03-11 DIAGNOSIS — Z3A00 Weeks of gestation of pregnancy not specified: Secondary | ICD-10-CM | POA: Diagnosis not present

## 2016-03-11 DIAGNOSIS — O24419 Gestational diabetes mellitus in pregnancy, unspecified control: Secondary | ICD-10-CM | POA: Insufficient documentation

## 2016-03-11 DIAGNOSIS — O0990 Supervision of high risk pregnancy, unspecified, unspecified trimester: Secondary | ICD-10-CM | POA: Diagnosis not present

## 2016-03-11 DIAGNOSIS — Z713 Dietary counseling and surveillance: Secondary | ICD-10-CM | POA: Diagnosis not present

## 2016-03-11 NOTE — Progress Notes (Signed)
  Patient was seen on 03/11/16  for Gestational Diabetes self-management class at [facility]. The following learning objectives were met by the patient during this course:   States the definition of Gestational Diabetes  States why dietary management is important in controlling blood glucose  Describes the effects each nutrient has on blood glucose levels  Demonstrates ability to create a balanced meal plan  Demonstrates carbohydrate counting   States when to check blood glucose levels  Demonstrates proper blood glucose monitoring techniques  States the effect of stress and exercise on blood glucose levels  States the importance of limiting caffeine and abstaining from alcohol and smoking  Blood glucose monitor given:yes Lot # [id number] Exp: 12/18 Blood glucose reading: 86  Patient instructed to monitor glucose levels: FBS: 60 - <90 1 hour: <140 2 hour: <120  *Patient received handouts:  Nutrition Diabetes and Pregnancy  Carbohydrate Counting List  Patient will be seen for follow-up as needed.

## 2016-03-12 ENCOUNTER — Other Ambulatory Visit: Payer: Self-pay | Admitting: Certified Nurse Midwife

## 2016-03-13 ENCOUNTER — Ambulatory Visit (HOSPITAL_COMMUNITY)
Admission: RE | Admit: 2016-03-13 | Discharge: 2016-03-13 | Disposition: A | Discharge status: Home / Self Care | Payer: Medicaid Other | Source: Ambulatory Visit | Attending: Certified Nurse Midwife | Admitting: Certified Nurse Midwife

## 2016-03-13 ENCOUNTER — Encounter (HOSPITAL_COMMUNITY): Payer: Self-pay

## 2016-03-13 DIAGNOSIS — O0933 Supervision of pregnancy with insufficient antenatal care, third trimester: Secondary | ICD-10-CM | POA: Diagnosis not present

## 2016-03-13 DIAGNOSIS — O0932 Supervision of pregnancy with insufficient antenatal care, second trimester: Secondary | ICD-10-CM

## 2016-03-13 DIAGNOSIS — O09292 Supervision of pregnancy with other poor reproductive or obstetric history, second trimester: Secondary | ICD-10-CM

## 2016-03-13 DIAGNOSIS — Z3A31 31 weeks gestation of pregnancy: Secondary | ICD-10-CM | POA: Insufficient documentation

## 2016-03-13 DIAGNOSIS — O24410 Gestational diabetes mellitus in pregnancy, diet controlled: Secondary | ICD-10-CM | POA: Insufficient documentation

## 2016-03-13 DIAGNOSIS — O0990 Supervision of high risk pregnancy, unspecified, unspecified trimester: Secondary | ICD-10-CM

## 2016-03-13 HISTORY — DX: Gestational diabetes mellitus in pregnancy, unspecified control: O24.419

## 2016-03-14 ENCOUNTER — Ambulatory Visit (HOSPITAL_COMMUNITY): Payer: Medicaid Other

## 2016-03-15 ENCOUNTER — Other Ambulatory Visit: Payer: Self-pay | Admitting: Certified Nurse Midwife

## 2016-03-15 DIAGNOSIS — O0990 Supervision of high risk pregnancy, unspecified, unspecified trimester: Secondary | ICD-10-CM

## 2016-03-19 ENCOUNTER — Encounter (HOSPITAL_COMMUNITY): Payer: Self-pay | Admitting: *Deleted

## 2016-03-19 ENCOUNTER — Inpatient Hospital Stay (HOSPITAL_COMMUNITY)
Admission: AD | Admit: 2016-03-19 | Discharge: 2016-03-19 | Disposition: A | Discharge status: Home / Self Care | Payer: Medicaid Other | Source: Ambulatory Visit | Attending: Family Medicine | Admitting: Family Medicine

## 2016-03-19 DIAGNOSIS — O24410 Gestational diabetes mellitus in pregnancy, diet controlled: Secondary | ICD-10-CM | POA: Diagnosis not present

## 2016-03-19 DIAGNOSIS — O4703 False labor before 37 completed weeks of gestation, third trimester: Secondary | ICD-10-CM | POA: Diagnosis not present

## 2016-03-19 DIAGNOSIS — Z3689 Encounter for other specified antenatal screening: Secondary | ICD-10-CM

## 2016-03-19 DIAGNOSIS — Z3A32 32 weeks gestation of pregnancy: Secondary | ICD-10-CM | POA: Diagnosis not present

## 2016-03-19 LAB — WET PREP, GENITAL
CLUE CELLS WET PREP: NONE SEEN
Sperm: NONE SEEN
TRICH WET PREP: NONE SEEN
Yeast Wet Prep HPF POC: NONE SEEN

## 2016-03-19 LAB — URINALYSIS, ROUTINE W REFLEX MICROSCOPIC
BILIRUBIN URINE: NEGATIVE
Glucose, UA: NEGATIVE mg/dL
HGB URINE DIPSTICK: NEGATIVE
KETONES UR: 20 mg/dL — AB
NITRITE: NEGATIVE
Protein, ur: NEGATIVE mg/dL
SPECIFIC GRAVITY, URINE: 1.009 (ref 1.005–1.030)
pH: 6 (ref 5.0–8.0)

## 2016-03-19 LAB — FETAL FIBRONECTIN: Fetal Fibronectin: NEGATIVE

## 2016-03-19 LAB — OB RESULTS CONSOLE GC/CHLAMYDIA: GC PROBE AMP, GENITAL: NEGATIVE

## 2016-03-19 NOTE — MAU Note (Signed)
Patient to mau via ems. Upon arrival patient reporting contractions and increasing pressure. Denies LOF or VB at this time. Report white discharge. +FM

## 2016-03-19 NOTE — MAU Provider Note (Signed)
History     CSN: [id number]  Arrival date and time: 03/19/16 1649   None     Chief Complaint  Patient presents with  . Contractions   G3P1011 @32 .2 weeks here with ctx. She reports ctx q2 min since 1530 today. She denies recent IC. Feels well hydrated. Pt was working today when ctx started and came by EMS. Reports good FM. No LOF or VB. Pregnancy complicated by A1GDM, late to care, and hx of 18 wk SAB.    OB History    Gravida Para Term Preterm AB Living   2 1 1   1 1    SAB TAB Ectopic Multiple Live Births   1     1 1       Past Medical History:  Diagnosis Date  . Asthma   . Gestational diabetes     Past Surgical History:  Procedure Laterality Date  . NO PAST SURGERIES      No family history on file.  Social History  Substance Use Topics  . Smoking status: Never Smoker  . Smokeless tobacco: Never Used  . Alcohol use No    Allergies:  Allergies  Allergen Reactions  . Benadryl [Diphenhydramine Hcl (Sleep)] Anaphylaxis  . Latex Anaphylaxis    Prescriptions Prior to Admission  Medication Sig Dispense Refill Last Dose  . ACCU-CHEK FASTCLIX LANCETS MISC Check glucose 4 times daily 100 each 5 Taking  . albuterol (PROVENTIL HFA;VENTOLIN HFA) 108 (90 Base) MCG/ACT inhaler Inhale 2 puffs into the lungs every 6 (six) hours as needed for wheezing or shortness of breath.   Taking  . glucose blood (ACCU-CHEK GUIDE) test strip Check glucose 4 times daily  Use as instructed 100 each 5 Taking  . Prenatal Vit-Fe Fumarate-FA (MULTIVITAMIN-PRENATAL) 27-0.8 MG TABS tablet Take 1 tablet by mouth daily at 12 noon.   Taking    Review of Systems  Gastrointestinal: Positive for abdominal pain.  Genitourinary: Negative for vaginal bleeding and vaginal discharge.   Physical Exam   Blood pressure 128/61, pulse 76, temperature 98.1 F (36.7 C), temperature source Oral, resp. rate 19, last menstrual period 08/06/2015, SpO2 100 %.  Physical Exam  Constitutional: She is oriented  to person, place, and time. She appears well-developed and well-nourished. No distress.  HENT:  Head: Normocephalic and atraumatic.  Neck: Normal range of motion.  Cardiovascular: Normal rate.   Respiratory: Effort normal.  GI: Soft. She exhibits no distension. There is no tenderness.  Genitourinary:  Genitourinary Comments: SVE: FT/40  Musculoskeletal: Normal range of motion.  Neurological: She is alert and oriented to person, place, and time.  Skin: Skin is warm and dry.  Psychiatric: She has a normal mood and affect.   EFM: 120 bpm, mod variability, + accels, no decels Toco: occasional  Results for orders placed or performed during the hospital encounter of 03/19/16 (from the past 24 hour(s))  Fetal fibronectin     Status: None   Collection Time: 03/19/16  5:10 PM  Result Value Ref Range   Fetal Fibronectin NEGATIVE NEGATIVE  Wet prep, genital     Status: Abnormal   Collection Time: 03/19/16  5:20 PM  Result Value Ref Range   Yeast Wet Prep HPF POC NONE SEEN NONE SEEN   Trich, Wet Prep NONE SEEN NONE SEEN   Clue Cells Wet Prep HPF POC NONE SEEN NONE SEEN   WBC, Wet Prep HPF POC MANY (A) NONE SEEN   Sperm NONE SEEN   Urinalysis, Routine w reflex microscopic  Status: Abnormal   Collection Time: 03/19/16  5:26 PM  Result Value Ref Range   Color, Urine YELLOW YELLOW   APPearance HAZY (A) CLEAR   Specific Gravity, Urine 1.009 1.005 - 1.030   pH 6.0 5.0 - 8.0   Glucose, UA NEGATIVE NEGATIVE mg/dL   Hgb urine dipstick NEGATIVE NEGATIVE   Bilirubin Urine NEGATIVE NEGATIVE   Ketones, ur 20 (A) NEGATIVE mg/dL   Protein, ur NEGATIVE NEGATIVE mg/dL   Nitrite NEGATIVE NEGATIVE   Leukocytes, UA TRACE (A) NEGATIVE   RBC / HPF 0-5 0 - 5 RBC/hpf   WBC, UA 0-5 0 - 5 WBC/hpf   Bacteria, UA RARE (A) NONE SEEN   Squamous Epithelial / LPF 6-30 (A) NONE SEEN   Mucous PRESENT     MAU Course  Procedures Po hydration  MDM Labs ordered and reviewed. No evidence of PTL. Ctx likely  caused by poor hydration. Pt reports fewer ctx since hydration. Cervix rechecked and unchanged. Stable for discharge home.   Assessment and Plan   1. [redacted] weeks gestation of pregnancy   2. NST (non-stress test) reactive   3. Threatened premature labor in third trimester    Discharge home Follow up in office tomorrow PTL precautions  Allergies as of 03/19/2016      Reactions   Benadryl [diphenhydramine Hcl (sleep)] Anaphylaxis   Latex Anaphylaxis      Medication List    TAKE these medications   ACCU-CHEK FASTCLIX LANCETS Misc Check glucose 4 times daily   albuterol 108 (90 Base) MCG/ACT inhaler Commonly known as:  PROVENTIL HFA;VENTOLIN HFA Inhale 2 puffs into the lungs every 6 (six) hours as needed for wheezing or shortness of breath.   glucose blood test strip Commonly known as:  ACCU-CHEK GUIDE Check glucose 4 times daily  Use as instructed   prenatal multivitamin Tabs tablet Take 1 tablet by mouth at bedtime.      Donette Larry, CNM 03/19/2016, 5:16 PM

## 2016-03-19 NOTE — MAU Note (Signed)
Called house coverage about obtaining a cab fair for this pt to take her to her house away. The pass will be given to patient

## 2016-03-19 NOTE — Discharge Instructions (Signed)

## 2016-03-19 NOTE — MAU Note (Signed)
Pt presents to MAU with ctx

## 2016-03-20 ENCOUNTER — Ambulatory Visit (INDEPENDENT_AMBULATORY_CARE_PROVIDER_SITE_OTHER): Payer: Medicaid Other | Admitting: Obstetrics and Gynecology

## 2016-03-20 VITALS — BP 122/77 | HR 73 | Wt 149.6 lb

## 2016-03-20 DIAGNOSIS — O0933 Supervision of pregnancy with insufficient antenatal care, third trimester: Secondary | ICD-10-CM

## 2016-03-20 DIAGNOSIS — O0932 Supervision of pregnancy with insufficient antenatal care, second trimester: Secondary | ICD-10-CM

## 2016-03-20 DIAGNOSIS — O0990 Supervision of high risk pregnancy, unspecified, unspecified trimester: Secondary | ICD-10-CM

## 2016-03-20 DIAGNOSIS — O24410 Gestational diabetes mellitus in pregnancy, diet controlled: Secondary | ICD-10-CM

## 2016-03-20 LAB — GC/CHLAMYDIA PROBE AMP (~~LOC~~) NOT AT ARMC
Chlamydia: NEGATIVE
Neisseria Gonorrhea: NEGATIVE

## 2016-03-20 NOTE — Progress Notes (Signed)
Urine has small leuks and hgb

## 2016-03-20 NOTE — Progress Notes (Signed)
   PRENATAL VISIT NOTE  Subjective:  Taylor PerchesZineb Maser is a 26 y.o. G2P1011 at 6827w3d being seen today for ongoing prenatal care.  She is currently monitored for the following issues for this high-risk pregnancy and has Supervision of high risk pregnancy, antepartum; Late prenatal care affecting pregnancy in second trimester; H/O miscarriage, currently pregnant, second trimester; Sebaceous cyst; and GDM (gestational diabetes mellitus) on her problem list.  Patient reports no complaints.  Contractions: Not present. Vag. Bleeding: None.  Movement: Present. Denies leaking of fluid.   The following portions of the patient's history were reviewed and updated as appropriate: allergies, current medications, past family history, past medical history, past social history, past surgical history and problem list. Problem list updated.  Objective:   Vitals:   03/20/16 0813  BP: 122/77  Pulse: 73  Weight: 149 lb 9.6 oz (67.9 kg)    Fetal Status: Fetal Heart Rate (bpm): 135 Fundal Height: 32 cm Movement: Present     General:  Alert, oriented and cooperative. Patient is in no acute distress.  Skin: Skin is warm and dry. No rash noted.   Cardiovascular: Normal heart rate noted  Respiratory: Normal respiratory effort, no problems with respiration noted  Abdomen: Soft, gravid, appropriate for gestational age. Pain/Pressure: Present     Pelvic:  Cervical exam deferred        Extremities: Normal range of motion.  Edema: None  Mental Status: Normal mood and affect. Normal behavior. Normal judgment and thought content.   Assessment and Plan:  Pregnancy: G2P1011 at 2927w3d  1. Supervision of high risk pregnancy, antepartum Patient is doing well without complaints  2. Diet controlled gestational diabetes mellitus (GDM) in third trimester CBG's reviewed and majority within range. Patient has some occasional high values p lunch or dinner as she is still experimenting with her meals 2/21 EFW 47%tile with  normal growth  3. Late prenatal care affecting pregnancy in second trimester Onset of care at 18 weeks  Preterm labor symptoms and general obstetric precautions including but not limited to vaginal bleeding, contractions, leaking of fluid and fetal movement were reviewed in detail with the patient. Please refer to After Visit Summary for other counseling recommendations.  Return in about 2 weeks (around 04/03/2016) for ROB, Faculty practice MD only.   Catalina AntiguaPeggy Naphtali Zywicki, MD

## 2016-03-22 ENCOUNTER — Encounter: Payer: Self-pay | Admitting: *Deleted

## 2016-03-26 ENCOUNTER — Encounter (HOSPITAL_COMMUNITY): Payer: Self-pay | Admitting: *Deleted

## 2016-03-26 ENCOUNTER — Inpatient Hospital Stay (HOSPITAL_COMMUNITY)
Admission: AD | Admit: 2016-03-26 | Discharge: 2016-03-26 | Disposition: A | Discharge status: Home / Self Care | Payer: Medicaid Other | Source: Ambulatory Visit | Attending: Obstetrics & Gynecology | Admitting: Obstetrics & Gynecology

## 2016-03-26 DIAGNOSIS — O99283 Endocrine, nutritional and metabolic diseases complicating pregnancy, third trimester: Secondary | ICD-10-CM | POA: Diagnosis not present

## 2016-03-26 DIAGNOSIS — Z3689 Encounter for other specified antenatal screening: Secondary | ICD-10-CM

## 2016-03-26 DIAGNOSIS — Z79899 Other long term (current) drug therapy: Secondary | ICD-10-CM | POA: Insufficient documentation

## 2016-03-26 DIAGNOSIS — O99513 Diseases of the respiratory system complicating pregnancy, third trimester: Secondary | ICD-10-CM | POA: Insufficient documentation

## 2016-03-26 DIAGNOSIS — Z3A33 33 weeks gestation of pregnancy: Secondary | ICD-10-CM | POA: Insufficient documentation

## 2016-03-26 DIAGNOSIS — J45909 Unspecified asthma, uncomplicated: Secondary | ICD-10-CM | POA: Diagnosis not present

## 2016-03-26 DIAGNOSIS — O4703 False labor before 37 completed weeks of gestation, third trimester: Secondary | ICD-10-CM | POA: Diagnosis not present

## 2016-03-26 DIAGNOSIS — K529 Noninfective gastroenteritis and colitis, unspecified: Secondary | ICD-10-CM | POA: Insufficient documentation

## 2016-03-26 DIAGNOSIS — O368130 Decreased fetal movements, third trimester, not applicable or unspecified: Secondary | ICD-10-CM | POA: Insufficient documentation

## 2016-03-26 DIAGNOSIS — E860 Dehydration: Secondary | ICD-10-CM | POA: Insufficient documentation

## 2016-03-26 LAB — URINALYSIS, ROUTINE W REFLEX MICROSCOPIC
BILIRUBIN URINE: NEGATIVE
Glucose, UA: NEGATIVE mg/dL
HGB URINE DIPSTICK: NEGATIVE
Ketones, ur: 5 mg/dL — AB
NITRITE: NEGATIVE
Protein, ur: NEGATIVE mg/dL
SPECIFIC GRAVITY, URINE: 1.024 (ref 1.005–1.030)
pH: 5 (ref 5.0–8.0)

## 2016-03-26 MED ORDER — PROMETHAZINE HCL 12.5 MG PO TABS: 12.5000 mg | ORAL_TABLET | Freq: Four times a day (QID) | ORAL | 0 refills | Status: DC | PRN

## 2016-03-26 MED ORDER — PROMETHAZINE HCL 25 MG RE SUPP: 25.0000 mg | Freq: Four times a day (QID) | RECTAL | 0 refills | Status: DC | PRN

## 2016-03-26 NOTE — Discharge Instructions (Signed)
Fetal Movement Counts Patient Name: ________________________________________________ Patient Due Date: ____________________ What is a fetal movement count? A fetal movement count is the number of times that you feel your baby move during a certain amount of time. This may also be called a fetal kick count. A fetal movement count is recommended for every pregnant woman. You may be asked to start counting fetal movements as early as week 28 of your pregnancy. Pay attention to when your baby is most active. You may notice your baby's sleep and wake cycles. You may also notice things that make your baby move more. You should do a fetal movement count:  When your baby is normally most active.  At the same time each day. A good time to count movements is while you are resting, after having something to eat and drink. How do I count fetal movements? 1. Find a quiet, comfortable area. Sit, or lie down on your side. 2. Write down the date, the start time and stop time, and the number of movements that you felt between those two times. Take this information with you to your health care visits. 3. For 2 hours, count kicks, flutters, swishes, rolls, and jabs. You should feel at least 10 movements during 2 hours. 4. You may stop counting after you have felt 10 movements. 5. If you do not feel 10 movements in 2 hours, have something to eat and drink. Then, keep resting and counting for 1 hour. If you feel at least 4 movements during that hour, you may stop counting. Contact a health care provider if:  You feel fewer than 4 movements in 2 hours.  Your baby is not moving like he or she usually does. Date: ____________ Start time: ____________ Stop time: ____________ Movements: ____________ Date: ____________ Start time: ____________ Stop time: ____________ Movements: ____________ Date: ____________ Start time: ____________ Stop time: ____________ Movements: ____________ Date: ____________ Start time:  ____________ Stop time: ____________ Movements: ____________ Date: ____________ Start time: ____________ Stop time: ____________ Movements: ____________ Date: ____________ Start time: ____________ Stop time: ____________ Movements: ____________ Date: ____________ Start time: ____________ Stop time: ____________ Movements: ____________ Date: ____________ Start time: ____________ Stop time: ____________ Movements: ____________ Date: ____________ Start time: ____________ Stop time: ____________ Movements: ____________ This information is not intended to replace advice given to you by your health care provider. Make sure you discuss any questions you have with your health care provider. Document Released: 02/06/2006 Document Revised: 09/06/2015 Document Reviewed: 02/16/2015 Elsevier Interactive Patient Education  2017 [employer] of the uterus can occur throughout pregnancy, but they are not always a sign that you are in labor. You may have practice contractions called Braxton Hicks contractions. These false labor contractions are sometimes confused with true labor. What are [place] contractions? Braxton Hicks contractions are tightening movements that occur in the muscles of the uterus before labor. Unlike true labor contractions, these contractions do not result in opening (dilation) and thinning of the cervix. Toward the end of pregnancy (32-34 weeks), Braxton Hicks contractions can happen more often and may become stronger. These contractions are sometimes difficult to tell apart from true labor because they can be very uncomfortable. You should not feel embarrassed if you go to the hospital with false labor. Sometimes, the only way to tell if you are in true labor is for your health care provider to look for changes in the cervix. The health care provider will do a physical exam and may monitor your contractions. If you  are not in true labor, the exam  should show that your cervix is not dilating and your water has not broken. If there are no prenatal problems or other health problems associated with your pregnancy, it is completely safe for you to be sent home with false labor. You may continue to have Braxton Hicks contractions until you go into true labor. How can I tell the difference between true labor and false labor?  Differences  False labor  Contractions last 30-70 seconds.: Contractions are usually shorter and not as strong as true labor contractions.  Contractions become very regular.: Contractions are usually irregular.  Discomfort is usually felt in the top of the uterus, and it spreads to the lower abdomen and low back.: Contractions are often felt in the front of the lower abdomen and in the groin.  Contractions do not go away with walking.: Contractions may go away when you walk around or change positions while lying down.  Contractions usually become more intense and increase in frequency.: Contractions get weaker and are shorter-lasting as time goes on.  The cervix dilates and gets thinner.: The cervix usually does not dilate or become thin. Follow these instructions at home:  Take over-the-counter and prescription medicines only as told by your health care provider.  Keep up with your usual exercises and follow other instructions from your health care provider.  Eat and drink lightly if you think you are going into labor.  If Braxton Hicks contractions are making you uncomfortable:  Change your position from lying down or resting to walking, or change from walking to resting.  Sit and rest in a tub of warm water.  Drink enough fluid to keep your urine clear or pale yellow. Dehydration may cause these contractions.  Do slow and deep breathing several times an hour.  Keep all follow-up prenatal visits as told by your health care provider. This is important. Contact a health care provider if:  You have a  fever.  You have continuous pain in your abdomen. Get help right away if:  Your contractions become stronger, more regular, and closer together.  You have fluid leaking or gushing from your vagina.  You pass blood-tinged mucus (bloody show).  You have bleeding from your vagina.  You have low back pain that you never had before.  You feel your babys head pushing down and causing pelvic pressure.  Your baby is not moving inside you as much as it used to. Summary  Contractions that occur before labor are called Braxton Hicks contractions, false labor, or practice contractions.  Braxton Hicks contractions are usually shorter, weaker, farther apart, and less regular than true labor contractions. True labor contractions usually become progressively stronger and regular and they become more frequent.  Manage discomfort from [facility] LP[facility] Hicks contractions by changing position, resting in a warm bath, drinking plenty of water, or practicing deep breathing. This information is not intended to replace advice given to you by your health care provider. Make sure you discuss any questions you have with your health care provider. Document Released: 01/07/2005 Document Revised: 11/27/2015 Document Reviewed: 11/27/2015 Elsevier Interactive Patient Education  2017 Elsevier Inc. [place] Diet A bland diet consists of foods that do not have a lot of fat or fiber. Foods without fat or fiber are easier for the body to digest. They are also less likely to irritate your mouth, throat, stomach, and other parts of your gastrointestinal tract. A bland diet is sometimes called a BRAT diet. What is my  plan? Your health care provider or dietitian may recommend specific changes to your diet to prevent and treat your symptoms, such as:  Eating small meals often.  Cooking food until it is soft enough to chew easily.  Chewing your food well.  Drinking fluids slowly.  Not eating foods that are very spicy, sour, or  fatty.  Not eating citrus fruits, such as oranges and grapefruit. What do I need to know about this diet?  Eat a variety of foods from the bland diet food list.  Do not follow a bland diet longer than you have to.  Ask your health care provider whether you should take vitamins. What foods can I eat? Grains   Hot cereals, such as cream of wheat. Bread, crackers, or tortillas made from refined white flour. Rice. Vegetables  Canned or cooked vegetables. Mashed or boiled potatoes. Fruits  Bananas. Applesauce. Other types of cooked or canned fruit with the skin and seeds removed, such as canned peaches or pears. Meats and Other Protein Sources  Scrambled eggs. Creamy peanut butter or other nut butters. Lean, well-cooked meats, such as chicken or fish. Tofu. Soups or broths. Dairy  Low-fat dairy products, such as milk, cottage cheese, or yogurt. Beverages  Water. Herbal tea. Apple juice. Sweets and Desserts  Pudding. Custard. Fruit gelatin. Ice cream. Fats and Oils  Mild salad dressings. Canola or olive oil. The items listed above may not be a complete list of allowed foods or beverages. Contact your dietitian for more options.  What foods are not recommended? Foods and ingredients that are often not recommended include:  Spicy foods, such as hot sauce or salsa.  Fried foods.  Sour foods, such as pickled or fermented foods.  Raw vegetables or fruits, especially citrus or berries.  Caffeinated drinks.  Alcohol.  Strongly flavored seasonings or condiments. The items listed above may not be a complete list of foods and beverages that are not allowed. Contact your dietitian for more information.  This information is not intended to replace advice given to you by your health care provider. Make sure you discuss any questions you have with your health care provider. Document Released: 05/01/2015 Document Revised: 06/15/2015 Document Reviewed: 01/19/2014 Elsevier Interactive  Patient Education  2017 [employer].

## 2016-03-26 NOTE — MAU Note (Signed)
Pt presents to MAU with complaints of vomiting, contractions and a decrease in fetal movement today. Denies any vaginal bleeding or abnormal discharge

## 2016-03-26 NOTE — MAU Provider Note (Signed)
History     CSN: [id number]  Arrival date and time: 03/26/16 1734   First Provider Initiated Contact with Patient 03/26/16 1804      Chief Complaint  Patient presents with  . Decreased Fetal Movement  . Contractions  . Emesis   G2P1001 @33 .2 weeks here with N/V, decreased FM, and ctx. She reports an episode first thing this am of N/V. She had not eaten anything prior but reports eating [employer] late last night. She reports another episode of vomiting after eating a bagal for lunch. She has tolerate po fluids for the last 3 hrs. No nausea currently. She denies diarrhea. No sick contacts. She reports onset of ctx q3 min, lasting 30 sec, about 2 hrs ago. No VB or LOF. She has felt 2 ctx since EFM applied here. She reports decreased FM. Review of record shows she was seen last week for preterm ctx. She was not dilated and FFN, wet prep, and GC/CT cultures were all neg.    OB History    Gravida Para Term Preterm AB Living   2 1 1   1 1    SAB TAB Ectopic Multiple Live Births   1     1 1       Past Medical History:  Diagnosis Date  . Asthma   . Gestational diabetes     Past Surgical History:  Procedure Laterality Date  . NO PAST SURGERIES      History reviewed. No pertinent family history.  Social History  Substance Use Topics  . Smoking status: Never Smoker  . Smokeless tobacco: Never Used  . Alcohol use No    Allergies:  Allergies  Allergen Reactions  . Benadryl [Diphenhydramine Hcl (Sleep)] Anaphylaxis  . Latex Anaphylaxis    Prescriptions Prior to Admission  Medication Sig Dispense Refill Last Dose  . albuterol (PROVENTIL HFA;VENTOLIN HFA) 108 (90 Base) MCG/ACT inhaler Inhale 2 puffs into the lungs every 6 (six) hours as needed for wheezing or shortness of breath.   Past Month at Unknown time  . Prenatal Vit-Fe Fumarate-FA (PRENATAL MULTIVITAMIN) TABS tablet Take 1 tablet by mouth at bedtime.   03/26/2016 at Unknown time  . ACCU-CHEK FASTCLIX LANCETS MISC Check  glucose 4 times daily 100 each 5 Taking  . glucose blood (ACCU-CHEK GUIDE) test strip Check glucose 4 times daily  Use as instructed 100 each 5 Taking    Review of Systems  Constitutional: Negative for fever.  Gastrointestinal: Positive for abdominal pain, nausea and vomiting. Negative for diarrhea.  Genitourinary: Negative for vaginal bleeding.   Physical Exam   Blood pressure 128/71, pulse 92, temperature 98.1 F (36.7 C), resp. rate 18, height 5\' 4"  (1.626 m), weight 69.4 kg (153 lb), last menstrual period 08/06/2015.  Physical Exam  Nursing note and vitals reviewed. Constitutional: She is oriented to person, place, and time. She appears well-developed and well-nourished. No distress.  HENT:  Head: Normocephalic and atraumatic.  Neck: Normal range of motion.  Cardiovascular: Normal rate.   Respiratory: Effort normal.  GI: Soft. She exhibits no distension. There is no tenderness.  Genitourinary:  Genitourinary Comments: SVE: closed/thick  Musculoskeletal: Normal range of motion.  Neurological: She is alert and oriented to person, place, and time.  Skin: Skin is warm and dry.  Psychiatric: She has a normal mood and affect.   EFM: 120 bpm, mod variability, + accels, no decels Toco: irregular  Results for orders placed or performed during the hospital encounter of 03/26/16 (from the past 24  hour(s))  Urinalysis, Routine w reflex microscopic     Status: Abnormal   Collection Time: 03/26/16  5:50 PM  Result Value Ref Range   Color, Urine YELLOW YELLOW   APPearance HAZY (A) CLEAR   Specific Gravity, Urine 1.024 1.005 - 1.030   pH 5.0 5.0 - 8.0   Glucose, UA NEGATIVE NEGATIVE mg/dL   Hgb urine dipstick NEGATIVE NEGATIVE   Bilirubin Urine NEGATIVE NEGATIVE   Ketones, ur 5 (A) NEGATIVE mg/dL   Protein, ur NEGATIVE NEGATIVE mg/dL   Nitrite NEGATIVE NEGATIVE   Leukocytes, UA TRACE (A) NEGATIVE   RBC / HPF 0-5 0 - 5 RBC/hpf   WBC, UA 0-5 0 - 5 WBC/hpf   Bacteria, UA RARE (A)  NONE SEEN   Squamous Epithelial / LPF 6-30 (A) NONE SEEN   Mucous PRESENT     MAU Course  Procedures Po hydration  MDM Labs ordered and reviewed. No evidence of PTL. Dehydration likely cause of ctx. Stable for discharge home.  Assessment and Plan   1. [redacted] weeks gestation of pregnancy   2. NST (non-stress test) reactive   3. Dehydration   4. Preterm uterine contractions, antepartum, third trimester   5. Gastroenteritis    Discharge home Follow up in office next week as scheduled [facility] PTL precautions BRAT diet Rx Phenergan po/pr  Allergies as of 03/26/2016      Reactions   Benadryl [diphenhydramine Hcl (sleep)] Anaphylaxis   Latex Anaphylaxis      Medication List    TAKE these medications   ACCU-CHEK FASTCLIX LANCETS Misc Check glucose 4 times daily   albuterol 108 (90 Base) MCG/ACT inhaler Commonly known as:  PROVENTIL HFA;VENTOLIN HFA Inhale 2 puffs into the lungs every 6 (six) hours as needed for wheezing or shortness of breath.   glucose blood test strip Commonly known as:  ACCU-CHEK GUIDE Check glucose 4 times daily  Use as instructed   prenatal multivitamin Tabs tablet Take 1 tablet by mouth at bedtime.   promethazine 12.5 MG tablet Commonly known as:  PHENERGAN Take 1 tablet (12.5 mg total) by mouth every 6 (six) hours as needed for nausea or vomiting.   promethazine 25 MG suppository Commonly known as:  PHENERGAN Place 1 suppository (25 mg total) rectally every 6 (six) hours as needed for nausea.      Donette Larry, CNM 03/26/2016, 6:29 PM

## 2016-04-04 ENCOUNTER — Encounter: Payer: Medicaid Other | Admitting: Obstetrics and Gynecology

## 2016-04-22 ENCOUNTER — Ambulatory Visit (INDEPENDENT_AMBULATORY_CARE_PROVIDER_SITE_OTHER): Payer: Medicaid Other | Admitting: Obstetrics & Gynecology

## 2016-04-22 ENCOUNTER — Inpatient Hospital Stay (EMERGENCY_DEPARTMENT_HOSPITAL)
Admission: AD | Admit: 2016-04-22 | Discharge: 2016-04-22 | Disposition: A | Discharge status: Home / Self Care | Payer: Medicaid Other | Source: Ambulatory Visit | Attending: Obstetrics & Gynecology | Admitting: Obstetrics & Gynecology

## 2016-04-22 ENCOUNTER — Encounter (HOSPITAL_COMMUNITY): Payer: Self-pay | Admitting: *Deleted

## 2016-04-22 VITALS — BP 140/85 | HR 87 | Wt 158.6 lb

## 2016-04-22 DIAGNOSIS — O133 Gestational [pregnancy-induced] hypertension without significant proteinuria, third trimester: Secondary | ICD-10-CM

## 2016-04-22 DIAGNOSIS — Z3A37 37 weeks gestation of pregnancy: Secondary | ICD-10-CM | POA: Insufficient documentation

## 2016-04-22 DIAGNOSIS — O163 Unspecified maternal hypertension, third trimester: Secondary | ICD-10-CM | POA: Insufficient documentation

## 2016-04-22 DIAGNOSIS — O99513 Diseases of the respiratory system complicating pregnancy, third trimester: Secondary | ICD-10-CM | POA: Insufficient documentation

## 2016-04-22 DIAGNOSIS — O0990 Supervision of high risk pregnancy, unspecified, unspecified trimester: Secondary | ICD-10-CM

## 2016-04-22 DIAGNOSIS — H538 Other visual disturbances: Secondary | ICD-10-CM

## 2016-04-22 DIAGNOSIS — Z79899 Other long term (current) drug therapy: Secondary | ICD-10-CM

## 2016-04-22 DIAGNOSIS — O24410 Gestational diabetes mellitus in pregnancy, diet controlled: Secondary | ICD-10-CM

## 2016-04-22 DIAGNOSIS — O24419 Gestational diabetes mellitus in pregnancy, unspecified control: Secondary | ICD-10-CM

## 2016-04-22 DIAGNOSIS — J45909 Unspecified asthma, uncomplicated: Secondary | ICD-10-CM

## 2016-04-22 LAB — COMPREHENSIVE METABOLIC PANEL
ALBUMIN: 3.1 g/dL — AB (ref 3.5–5.0)
ALT: 12 U/L — ABNORMAL LOW (ref 14–54)
ANION GAP: 10 (ref 5–15)
AST: 18 U/L (ref 15–41)
Alkaline Phosphatase: 112 U/L (ref 38–126)
BILIRUBIN TOTAL: 0.4 mg/dL (ref 0.3–1.2)
BUN: 12 mg/dL (ref 6–20)
CO2: 20 mmol/L — ABNORMAL LOW (ref 22–32)
Calcium: 8.7 mg/dL — ABNORMAL LOW (ref 8.9–10.3)
Chloride: 105 mmol/L (ref 101–111)
Creatinine, Ser: 0.47 mg/dL (ref 0.44–1.00)
Glucose, Bld: 103 mg/dL — ABNORMAL HIGH (ref 65–99)
POTASSIUM: 3.6 mmol/L (ref 3.5–5.1)
Sodium: 135 mmol/L (ref 135–145)
Total Protein: 6.5 g/dL (ref 6.5–8.1)

## 2016-04-22 LAB — CBC
HEMATOCRIT: 34.5 % — AB (ref 36.0–46.0)
HEMOGLOBIN: 11.6 g/dL — AB (ref 12.0–15.0)
MCH: 31.6 pg (ref 26.0–34.0)
MCHC: 33.6 g/dL (ref 30.0–36.0)
MCV: 94 fL (ref 78.0–100.0)
Platelets: 205 10*3/uL (ref 150–400)
RBC: 3.67 MIL/uL — AB (ref 3.87–5.11)
RDW: 12.8 % (ref 11.5–15.5)
WBC: 11.8 10*3/uL — ABNORMAL HIGH (ref 4.0–10.5)

## 2016-04-22 LAB — PROTEIN / CREATININE RATIO, URINE
Creatinine, Urine: 146 mg/dL
PROTEIN CREATININE RATIO: 0.11 mg/mg{creat} (ref 0.00–0.15)
Total Protein, Urine: 16 mg/dL

## 2016-04-22 NOTE — Progress Notes (Signed)
   PRENATAL VISIT NOTE  Subjective:  Keshanna Riso is a 26 y.o. G2P1011 at [redacted]w[redacted]d being seen today for ongoing prenatal care.  She is currently monitored for the following issues for this high-risk pregnancy and has Supervision of high risk pregnancy, antepartum; Late prenatal care affecting pregnancy in second trimester; H/O miscarriage, currently pregnant, second trimester; Sebaceous cyst; and GDM (gestational diabetes mellitus) on her problem list.  Patient reports lightheadedness, feeling like she will faint, and seeing grey spots in her visual field. No headaches, nausea, vomiting or abdominal pain. Patient also has not been seen in 4 weeks due to missed appointments etc.   Contractions: Irritability. Vag. Bleeding: None.  Movement: Present. Denies leaking of fluid.   The following portions of the patient's history were reviewed and updated as appropriate: allergies, current medications, past family history, past medical history, past social history, past surgical history and problem list. Problem list updated.  Objective:   Vitals:   04/22/16 1548  BP: 140/85  Pulse: 87  Weight: 158 lb 9.6 oz (71.9 kg)    Fetal Status: Fetal Heart Rate (bpm): 140   Movement: Present     General:  Alert, oriented and cooperative. Patient is in no acute distress.  Skin: Skin is warm and dry. No rash noted.   Cardiovascular: Normal heart rate noted  Respiratory: Normal respiratory effort, no problems with respiration noted  Abdomen: Soft, gravid, appropriate for gestational age. Pain/Pressure: Present     Pelvic:  Cervical exam deferred        Extremities: Normal range of motion.  Edema: None  Mental Status: Normal mood and affect. Normal behavior. Normal judgment and thought content.   Udip: Trace protein  Assessment and Plan:  Pregnancy: G2P1011 at [redacted]w[redacted]d  1. Elevated blood pressure complicating pregnancy in third trimester, antepartum Given ?scotomata, will send to MAU for evaluation for  possible preeclampsia.  On call team and MAU team notified.   2. Diet controlled gestational diabetes mellitus (GDM) in third trimester Has not checked in 2 days.  Reports good control.   3. Supervision of high risk pregnancy, antepartum She needs GBS and pelvic cultures; MAU team notified. Preterm labor symptoms and general obstetric precautions including but not limited to vaginal bleeding, contractions, leaking of fluid and fetal movement were reviewed in detail with the patient. Please refer to After Visit Summary for other counseling recommendations.  Return in about 1 week (around 04/29/2016) for OB Visit.   Tereso Newcomer, MD

## 2016-04-22 NOTE — Progress Notes (Signed)
Pt c/o lightheadedness x 2 wks and visual disturbances x 1 week. Pt states she has not checked her sugars in 2 days; needs rf on test strips. Pt has rf's on file at [facility]; she will call to request refill.

## 2016-04-22 NOTE — MAU Provider Note (Signed)
History     CSN: [id number]  Arrival date and time: 04/22/16 1618   First Provider Initiated Contact with Patient 04/22/16 1734      Chief Complaint  Patient presents with  . Hypertension  . Blurred Vision   Patient is a 26 year old G2 P1 at 37 weeks and 1 day who presents to the MA U from the office with elevated blood pressures and floaters in her eyes for 1 week. She denies headache blurred vision lower extremity swelling or right upper quadrant pain. She does report that she is a gestational diabetic and has not been monitoring her sugars well. She reports no history of having elevated blood pressures. She reports the floaters in her eyes are not constant they come and go and currently she is not having any.    OB History    Gravida Para Term Preterm AB Living   SAB TAB Ectopic Multiple Live Births   Past Medical History:  Diagnosis Date  . Asthma   . Gestational diabetes     Past Surgical History:  Procedure Laterality Date  . NO PAST SURGERIES      History reviewed. No pertinent family history.  Social History  Substance Use Topics  . Smoking status: Never Smoker  . Smokeless tobacco: Never Used  . Alcohol use No    Allergies:  Allergies  Allergen Reactions  . Benadryl [Diphenhydramine Hcl (Sleep)] Anaphylaxis  . Latex Anaphylaxis    Prescriptions Prior to Admission  Medication Sig Dispense Refill Last Dose  . albuterol (PROVENTIL HFA;VENTOLIN HFA) 108 (90 Base) MCG/ACT inhaler Inhale 2 puffs into the lungs every 6 (six) hours as needed for wheezing or shortness of breath.   Past Month at Unknown time  . Prenatal Vit-Fe Fumarate-FA (PRENATAL MULTIVITAMIN) TABS tablet Take 1 tablet by mouth at bedtime.   04/22/2016 at Unknown time  . ACCU-CHEK FASTCLIX LANCETS MISC Check glucose 4 times daily 100 each 5 Taking  . glucose blood (ACCU-CHEK GUIDE) test strip Check glucose 4 times daily  Use as instructed 100 each 5 Taking     Review of Systems  Constitutional: Negative for chills and fever.  HENT: Negative for congestion and rhinorrhea.   Eyes: Positive for visual disturbance. Negative for photophobia, pain and redness.  Respiratory: Negative for cough and shortness of breath.   Cardiovascular: Negative for chest pain and palpitations.  Gastrointestinal: Negative for abdominal distention, abdominal pain, constipation, diarrhea, nausea and vomiting.  Genitourinary: Negative for difficulty urinating, dysuria and frequency.  Neurological: Negative for dizziness and seizures.   Physical Exam   Blood pressure 116/66, pulse 85, temperature 98.2 F (36.8 C), temperature source Oral, resp. rate 18, last menstrual period 08/06/2015.  Physical Exam  Constitutional: She is oriented to person, place, and time. She appears well-developed and well-nourished.  HENT:  Head: Normocephalic and atraumatic.  Neck: Normal range of motion. Neck supple.  Cardiovascular: Normal rate and intact distal pulses.   No murmur heard. Respiratory: Effort normal. No respiratory distress.  GI: Soft. She exhibits no distension. There is no tenderness. There is no rebound and no guarding.  Musculoskeletal: Normal range of motion. She exhibits no edema.  Neurological: She is alert and oriented to person, place, and time. She displays normal reflexes. No cranial nerve deficit. She exhibits normal muscle tone. Coordination normal.  Skin: Skin is warm and dry.    MAU  Course  Procedures  MDM In MA U patient underwent evaluation for pregnancy-induced hypertension or preeclampsia. -Had CMP CBC and protein crowning ratio all which were within normal limits. -Had serial blood pressures some of which were elevated above 140/90 however they were not consistently elevated and she remained in the MA U for 4 hours and did not have blood pressures that were elevated 4 hours apart. -Given that she has no symptoms at the current time, and a history  of floaters with some elevated blood pressures we will not admit for induction at this time but we will have her follow up tomorrow with her primary OB for repeat blood pressure check. -Was noted that patient has not had GBS yet done and she should follow up in her office tomorrow to have GBS collected.  Assessment and Plan  #1: Elevated blood pressure in pregnancy likely patient will get a diagnosis of gestational hypertension. She needs close follow-up in her office for blood pressure checks. At this time however patient does not meet criteria and she can follow up in the office tomorrow.  Ernestina Penna 04/22/2016, 8:29 PM

## 2016-04-22 NOTE — Discharge Instructions (Signed)
Hypertension During Pregnancy Hypertension is also called high blood pressure. High blood pressure means that the force of your blood moving in your body is too strong. When you are pregnant, this condition should be watched carefully. It can cause problems for you and your baby. Follow these instructions at home: Eating and drinking  Drink enough fluid to keep your pee (urine) clear or pale yellow.  Eat healthy foods that are low in salt (sodium). ? Do not add salt to your food. ? Check labels on foods and drinks to see much salt is in them. Look on the label where you see "Sodium." Lifestyle  Do not use any products that contain nicotine or tobacco, such as cigarettes and e-cigarettes. If you need help quitting, ask your doctor.  Do not use alcohol.  Avoid caffeine.  Avoid stress. Rest and get plenty of sleep. General instructions  Take over-the-counter and prescription medicines only as told by your doctor.  While lying down, lie on your left side. This keeps pressure off your baby.  While sitting or lying down, raise (elevate) your feet. Try putting some pillows under your lower legs.  Exercise regularly. Ask your doctor what kinds of exercise are best for you.  Keep all prenatal and follow-up visits as told by your doctor. This is important. Contact a doctor if:  You have symptoms that your doctor told you to watch for, such as: ? Fever. ? Throwing up (vomiting). ? Headache. Get help right away if:  You have very bad pain in your belly (abdomen).  You are throwing up, and this does not get better with treatment.  You suddenly get swelling in your hands, ankles, or face.  You gain 4 lb (1.8 kg) or more in 1 week.  You get bleeding from your vagina.  You have blood in your pee.  You do not feel your baby moving as much as normal.  You have a change in vision.  You have muscle twitching or sudden tightening (spasms).  You have trouble breathing.  Your lips  or fingernails turn blue. This information is not intended to replace advice given to you by your health care provider. Make sure you discuss any questions you have with your health care provider. Document Released: 02/09/2010 Document Revised: 09/19/2015 Document Reviewed: 09/19/2015 Elsevier Interactive Patient Education  2017 [employer]  

## 2016-04-22 NOTE — Patient Instructions (Signed)
Return to clinic for any scheduled appointments or obstetric concerns, or go to MAU for evaluation  

## 2016-04-22 NOTE — MAU Note (Signed)
Pt sent from MD office with elevated BP, pt states she has been feeling like she was going to faint, has seen floaters for the past week.  Denies HA or epigastric pain.  Has sporadic contractions, denies bleeding or LOF.

## 2016-04-23 ENCOUNTER — Inpatient Hospital Stay (HOSPITAL_COMMUNITY)
Admission: AD | Admit: 2016-04-23 | Discharge: 2016-04-26 | DRG: 775 | Disposition: A | Discharge status: Home / Self Care | Payer: Medicaid Other | Source: Ambulatory Visit | Attending: Family Medicine | Admitting: Family Medicine

## 2016-04-23 ENCOUNTER — Ambulatory Visit (INDEPENDENT_AMBULATORY_CARE_PROVIDER_SITE_OTHER): Payer: Medicaid Other | Admitting: Obstetrics and Gynecology

## 2016-04-23 ENCOUNTER — Encounter (HOSPITAL_COMMUNITY): Payer: Self-pay | Admitting: *Deleted

## 2016-04-23 VITALS — BP 142/84 | HR 102

## 2016-04-23 DIAGNOSIS — O134 Gestational [pregnancy-induced] hypertension without significant proteinuria, complicating childbirth: Principal | ICD-10-CM | POA: Diagnosis present

## 2016-04-23 DIAGNOSIS — O24420 Gestational diabetes mellitus in childbirth, diet controlled: Secondary | ICD-10-CM | POA: Diagnosis present

## 2016-04-23 DIAGNOSIS — O139 Gestational [pregnancy-induced] hypertension without significant proteinuria, unspecified trimester: Secondary | ICD-10-CM | POA: Insufficient documentation

## 2016-04-23 DIAGNOSIS — O24429 Gestational diabetes mellitus in childbirth, unspecified control: Secondary | ICD-10-CM | POA: Diagnosis not present

## 2016-04-23 DIAGNOSIS — O9952 Diseases of the respiratory system complicating childbirth: Secondary | ICD-10-CM | POA: Diagnosis present

## 2016-04-23 DIAGNOSIS — O0990 Supervision of high risk pregnancy, unspecified, unspecified trimester: Secondary | ICD-10-CM

## 2016-04-23 DIAGNOSIS — O133 Gestational [pregnancy-induced] hypertension without significant proteinuria, third trimester: Secondary | ICD-10-CM

## 2016-04-23 DIAGNOSIS — O9081 Anemia of the puerperium: Secondary | ICD-10-CM | POA: Diagnosis not present

## 2016-04-23 DIAGNOSIS — O24410 Gestational diabetes mellitus in pregnancy, diet controlled: Secondary | ICD-10-CM

## 2016-04-23 DIAGNOSIS — O24419 Gestational diabetes mellitus in pregnancy, unspecified control: Secondary | ICD-10-CM | POA: Diagnosis present

## 2016-04-23 DIAGNOSIS — Z3A37 37 weeks gestation of pregnancy: Secondary | ICD-10-CM

## 2016-04-23 DIAGNOSIS — D649 Anemia, unspecified: Secondary | ICD-10-CM | POA: Diagnosis not present

## 2016-04-23 DIAGNOSIS — J45909 Unspecified asthma, uncomplicated: Secondary | ICD-10-CM | POA: Diagnosis present

## 2016-04-23 LAB — CBC WITH DIFFERENTIAL/PLATELET
BASOS ABS: 0 10*3/uL (ref 0.0–0.1)
BASOS PCT: 0 %
Eosinophils Absolute: 0.1 10*3/uL (ref 0.0–0.7)
Eosinophils Relative: 1 %
HEMATOCRIT: 34.5 % — AB (ref 36.0–46.0)
HEMOGLOBIN: 11.8 g/dL — AB (ref 12.0–15.0)
LYMPHS PCT: 19 %
Lymphs Abs: 2 10*3/uL (ref 0.7–4.0)
MCH: 32.1 pg (ref 26.0–34.0)
MCHC: 34.2 g/dL (ref 30.0–36.0)
MCV: 93.8 fL (ref 78.0–100.0)
MONO ABS: 0.6 10*3/uL (ref 0.1–1.0)
Monocytes Relative: 5 %
NEUTROS ABS: 7.9 10*3/uL — AB (ref 1.7–7.7)
NEUTROS PCT: 75 %
Platelets: 219 10*3/uL (ref 150–400)
RBC: 3.68 MIL/uL — AB (ref 3.87–5.11)
RDW: 12.8 % (ref 11.5–15.5)
WBC: 10.5 10*3/uL (ref 4.0–10.5)

## 2016-04-23 LAB — COMPREHENSIVE METABOLIC PANEL
ALBUMIN: 3.2 g/dL — AB (ref 3.5–5.0)
ALT: 14 U/L (ref 14–54)
AST: 19 U/L (ref 15–41)
Alkaline Phosphatase: 118 U/L (ref 38–126)
Anion gap: 7 (ref 5–15)
BILIRUBIN TOTAL: 0.3 mg/dL (ref 0.3–1.2)
BUN: 11 mg/dL (ref 6–20)
CO2: 22 mmol/L (ref 22–32)
CREATININE: 0.47 mg/dL (ref 0.44–1.00)
Calcium: 8.9 mg/dL (ref 8.9–10.3)
Chloride: 106 mmol/L (ref 101–111)
GFR calc Af Amer: >60 mL/min (ref >60)
GFR calc non Af Amer: >60 mL/min (ref >60)
GLUCOSE: 106 mg/dL — AB (ref 65–99)
POTASSIUM: 4.1 mmol/L (ref 3.5–5.1)
Sodium: 135 mmol/L (ref 135–145)
TOTAL PROTEIN: 6.3 g/dL — AB (ref 6.5–8.1)

## 2016-04-23 LAB — GLUCOSE, CAPILLARY
GLUCOSE-CAPILLARY: 138 mg/dL — AB (ref 65–99)
GLUCOSE-CAPILLARY: 94 mg/dL (ref 65–99)
Glucose-Capillary: 151 mg/dL — ABNORMAL HIGH (ref 65–99)
Glucose-Capillary: 86 mg/dL (ref 65–99)
Glucose-Capillary: 88 mg/dL (ref 65–99)

## 2016-04-23 LAB — PROTEIN / CREATININE RATIO, URINE
Creatinine, Urine: 175 mg/dL
Protein Creatinine Ratio: 0.11 mg/mg{Cre} (ref 0.00–0.15)
Total Protein, Urine: 19 mg/dL

## 2016-04-23 LAB — TYPE AND SCREEN
ABO/RH(D): A POS
ANTIBODY SCREEN: NEGATIVE

## 2016-04-23 LAB — CBC
HEMATOCRIT: 31.1 % — AB (ref 36.0–46.0)
HEMOGLOBIN: 10.7 g/dL — AB (ref 12.0–15.0)
MCH: 32.3 pg (ref 26.0–34.0)
MCHC: 34.4 g/dL (ref 30.0–36.0)
MCV: 94 fL (ref 78.0–100.0)
PLATELETS: 177 10*3/uL (ref 150–400)
RBC: 3.31 MIL/uL — AB (ref 3.87–5.11)
RDW: 13 % (ref 11.5–15.5)
WBC: 14.7 10*3/uL — AB (ref 4.0–10.5)

## 2016-04-23 LAB — GROUP B STREP BY PCR: Group B strep by PCR: NEGATIVE

## 2016-04-23 MED ORDER — SOD CITRATE-CITRIC ACID 500-334 MG/5ML PO SOLN
30.0000 mL | ORAL | Status: DC | PRN
  Administered 2016-04-24: 30 mL via ORAL
  Filled 2016-04-23: qty 15

## 2016-04-23 MED ORDER — ONDANSETRON HCL 4 MG/2ML IJ SOLN
4.0000 mg | Freq: Four times a day (QID) | INTRAMUSCULAR | Status: DC | PRN
  Administered 2016-04-24: 4 mg via INTRAVENOUS
  Filled 2016-04-23: qty 2

## 2016-04-23 MED ORDER — OXYTOCIN BOLUS FROM INFUSION
500.0000 mL | Freq: Once | INTRAVENOUS | Status: AC
  Administered 2016-04-24: 500 mL via INTRAVENOUS

## 2016-04-23 MED ORDER — MISOPROSTOL 25 MCG QUARTER TABLET
25.0000 ug | ORAL_TABLET | ORAL | Status: DC | PRN
  Filled 2016-04-23: qty 1

## 2016-04-23 MED ORDER — FENTANYL CITRATE (PF) 100 MCG/2ML IJ SOLN
100.0000 ug | INTRAMUSCULAR | Status: DC | PRN
  Administered 2016-04-23 (×2): 100 ug via INTRAVENOUS
  Filled 2016-04-23: qty 2

## 2016-04-23 MED ORDER — OXYTOCIN 40 UNITS IN LACTATED RINGERS INFUSION - SIMPLE MED
1.0000 m[IU]/min | INTRAVENOUS | Status: DC
Start: 2016-04-23 — End: 2016-04-24
  Administered 2016-04-23: 2 m[IU]/min via INTRAVENOUS

## 2016-04-23 MED ORDER — LACTATED RINGERS IV SOLN
INTRAVENOUS | Status: DC
  Administered 2016-04-23 (×2): via INTRAVENOUS

## 2016-04-23 MED ORDER — OXYCODONE-ACETAMINOPHEN 5-325 MG PO TABS
1.0000 | ORAL_TABLET | ORAL | Status: DC | PRN
  Administered 2016-04-24 – 2016-04-26 (×4): 1 via ORAL
  Filled 2016-04-23 (×4): qty 1

## 2016-04-23 MED ORDER — LIDOCAINE HCL (PF) 1 % IJ SOLN
30.0000 mL | INTRAMUSCULAR | Status: DC | PRN
  Filled 2016-04-23: qty 30

## 2016-04-23 MED ORDER — FENTANYL 2.5 MCG/ML BUPIVACAINE 1/10 % EPIDURAL INFUSION (WH - ANES)
14.0000 mL/h | INTRAMUSCULAR | Status: DC | PRN
  Administered 2016-04-24: 14 mL/h via EPIDURAL

## 2016-04-23 MED ORDER — TERBUTALINE SULFATE 1 MG/ML IJ SOLN
0.2500 mg | Freq: Once | INTRAMUSCULAR | Status: AC | PRN
  Administered 2016-04-24: 0.25 mg via SUBCUTANEOUS

## 2016-04-23 MED ORDER — EPHEDRINE 5 MG/ML INJ
10.0000 mg | INTRAVENOUS | Status: DC | PRN
  Administered 2016-04-24: 10 mg via INTRAVENOUS
  Filled 2016-04-23: qty 2

## 2016-04-23 MED ORDER — OXYTOCIN 40 UNITS IN LACTATED RINGERS INFUSION - SIMPLE MED
2.5000 [IU]/h | INTRAVENOUS | Status: DC
  Administered 2016-04-24: 2.5 [IU]/h via INTRAVENOUS
  Filled 2016-04-23 (×2): qty 1000

## 2016-04-23 MED ORDER — FENTANYL 2.5 MCG/ML BUPIVACAINE 1/10 % EPIDURAL INFUSION (WH - ANES)
INTRAMUSCULAR | Status: AC
  Filled 2016-04-23: qty 100

## 2016-04-23 MED ORDER — ACETAMINOPHEN 325 MG PO TABS: 650.0000 mg | ORAL_TABLET | ORAL | Status: DC | PRN

## 2016-04-23 MED ORDER — LACTATED RINGERS IV SOLN: 500.0000 mL | INTRAVENOUS | Status: DC | PRN

## 2016-04-23 MED ORDER — TERBUTALINE SULFATE 1 MG/ML IJ SOLN
0.2500 mg | Freq: Once | INTRAMUSCULAR | Status: DC | PRN
  Filled 2016-04-23: qty 1

## 2016-04-23 MED ORDER — PHENYLEPHRINE 40 MCG/ML (10ML) SYRINGE FOR IV PUSH (FOR BLOOD PRESSURE SUPPORT)
PREFILLED_SYRINGE | INTRAVENOUS | Status: AC
  Filled 2016-04-23: qty 10

## 2016-04-23 MED ORDER — EPHEDRINE 5 MG/ML INJ
10.0000 mg | INTRAVENOUS | Status: DC | PRN
  Filled 2016-04-23: qty 4
  Filled 2016-04-23: qty 2

## 2016-04-23 MED ORDER — PHENYLEPHRINE 40 MCG/ML (10ML) SYRINGE FOR IV PUSH (FOR BLOOD PRESSURE SUPPORT)
80.0000 ug | PREFILLED_SYRINGE | INTRAVENOUS | Status: DC | PRN
  Administered 2016-04-24 (×2): 80 ug via INTRAVENOUS
  Filled 2016-04-23: qty 5

## 2016-04-23 MED ORDER — PHENYLEPHRINE 40 MCG/ML (10ML) SYRINGE FOR IV PUSH (FOR BLOOD PRESSURE SUPPORT)
80.0000 ug | PREFILLED_SYRINGE | INTRAVENOUS | Status: DC | PRN
  Filled 2016-04-23: qty 5

## 2016-04-23 MED ORDER — LACTATED RINGERS IV SOLN
500.0000 mL | Freq: Once | INTRAVENOUS | Status: AC
  Administered 2016-04-23: 500 mL via INTRAVENOUS

## 2016-04-23 MED ORDER — DIPHENHYDRAMINE HCL 50 MG/ML IJ SOLN: 12.5000 mg | INTRAMUSCULAR | Status: DC | PRN

## 2016-04-23 MED ORDER — FENTANYL CITRATE (PF) 100 MCG/2ML IJ SOLN
INTRAMUSCULAR | Status: AC
  Filled 2016-04-23: qty 2

## 2016-04-23 MED ORDER — OXYCODONE-ACETAMINOPHEN 5-325 MG PO TABS: 2.0000 | ORAL_TABLET | ORAL | Status: DC | PRN

## 2016-04-23 NOTE — Anesthesia Pain Management Evaluation Note (Signed)
  CRNA Pain Management Visit Note  Patient: Taylor Harding, 26 y.o., female  "Hello I am a member of the anesthesia team at [facility]. We have an anesthesia team available at all times to provide care throughout the hospital, including epidural management and anesthesia for C-section. I don't know your plan for the delivery whether it a natural birth, water birth, IV sedation, nitrous supplementation, doula or epidural, but we want to meet your pain goals."   1.Was your pain managed to your expectations on prior hospitalizations?   Yes   2.What is your expectation for pain management during this hospitalization?     Epidural  3.How can we help you reach that goal?   Record the patient's initial score and the patient's pain goal.   Pain: 6  Pain Goal: 9 [facility] wants you to be able to say your pain was always managed very well.  Laban Emperor 04/23/2016

## 2016-04-23 NOTE — Progress Notes (Signed)
LABOR PROGRESS NOTE  Taylor Harding is a 26 y.o. G2P1011 at [redacted]w[redacted]d  admitted for IOL in the setting of GHTN and A1GDM.  Subjective: Patient appears more comfortable now after receiving pain meds. Patient without any complaints at the moment.  Objective: BP 139/67   Pulse 90   Temp 98 F (36.7 C) (Oral)   Resp 17   Ht  (1.651 m)   Wt 158 lb (71.7 kg)   LMP 08/06/2015 (Exact Date) Comment: double shielded, spoke to MD to verify if xray needed.  BMI 26.29 kg/m  or  Vitals:   04/23/16 1218 04/23/16 1231 04/23/16 1502 04/23/16 1710  BP: 139/70 132/63 137/71 139/67  Pulse: 84 86 80 90  Resp:  Temp:  98.1 F (36.7 C)  98 F (36.7 C)  TempSrc:  Oral  Oral  Weight:      Height:        Dilation: 4 Effacement (%): Thick Cervical Position: Middle Station: -2 Presentation: Vertex Exam by:: CGoodnight, RN   Labs: Lab Results  Component Value Date   WBC 10.5 04/23/2016   HGB 11.8 (L) 04/23/2016   HCT 34.5 (L) 04/23/2016   MCV 93.8 04/23/2016   PLT 219 04/23/2016    Patient Active Problem List   Diagnosis Date Noted  . Gestational hypertension 04/23/2016  . Normal labor 04/23/2016  . GDM (gestational diabetes mellitus) 03/04/2016  . Sebaceous cyst 02/12/2016  . Supervision of high risk pregnancy, antepartum 12/05/2015  . Late prenatal care affecting pregnancy in second trimester 12/05/2015  . H/O miscarriage, currently pregnant, second trimester 12/05/2015    Assessment / Plan: 26 y.o. G2P1011 at [redacted]w[redacted]d here for IOL in the setting of GHTN, A1GDM  Labor: foley bulb in place, expectant management  Fetal Wellbeing:  Category 1 Pain Control:  IV pain meds (fentanyl) Anticipated MOD:  Vaginal  Lovena Neighbours, MD 04/23/2016, 6:06 PM

## 2016-04-23 NOTE — Progress Notes (Signed)
Labor Progress Note Taylor Harding is a 26 y.o. G2P1011 at [redacted]w[redacted]d presented for IOL due gHTN.  S: she is lying comfortable. Denies headache, vision changes or shortness of breath.   O:  BP (!) 141/71   Pulse 74   Temp 97.8 F (36.6 C) (Axillary)   Resp 18   Ht  (1.651 m)   Wt 158 lb (71.7 kg)   LMP 08/06/2015 (Exact Date) Comment: double shielded, spoke to MD to verify if xray needed.  BMI 26.29 kg/m  EFM: 120/mod var/no decels  CVE: Dilation: 4 Effacement (%): Thick Cervical Position: Middle Station: -3 Presentation: Vertex Exam by:: stone rnc   A&P: 26 y.o. G2P1011 [redacted]w[redacted]d here for IOL for gHTN #gHTN: last BP 141/71 but taken with hand kinked per RN. BP within normal prior to that. Pre-E labs normal #Labor: continue pit. She is contracting every 2-3 minutes on toco #Pain: as needed pain meds for now. Epidural upon request #FWB: CAT-1 #GBS: neg  Almon Hercules, MD 9:12 PM

## 2016-04-23 NOTE — Progress Notes (Signed)
Patient states that she had some contractions last night, none today, reports good fetal movement. Pt also states that she has not picked up additional lancets from pharmacy. Pt reports feeling light headed, dizzy and seeing spots.

## 2016-04-23 NOTE — Progress Notes (Signed)
Prenatal Visit Note Date: 04/23/2016 Clinic: [facility]  Subjective:  Taylor Harding is a 26 y.o. G2P1011 at [redacted]w[redacted]d being seen today for ongoing prenatal care.  She is currently monitored for the following issues for this high-risk pregnancy and has Supervision of high risk pregnancy, antepartum; Late prenatal care affecting pregnancy in second trimester; H/O miscarriage, currently pregnant, second trimester; Sebaceous cyst; GDM (gestational diabetes mellitus); and Gestational hypertension on her problem list.  Patient reports still seeing floaters like yesterday  Contractions: Irregular. Vag. Bleeding: None.  Movement: Present. Denies leaking of fluid.   The following portions of the patient's history were reviewed and updated as appropriate: allergies, current medications, past family history, past medical history, past social history, past surgical history and problem list. Problem list updated.  Objective:   Vitals:   04/23/16 1118 04/23/16 1123  BP: (!) 141/80 (!) 142/84  Pulse: 97 (!) 102    Fetal Status: Fetal Heart Rate (bpm): 140   Movement: Present  Presentation: Vertex  General:  Alert, oriented and cooperative. Patient is in no acute distress.  Skin: Skin is warm and dry. No rash noted.   Cardiovascular: Normal heart rate noted  Respiratory: Normal respiratory effort, no problems with respiration noted  Abdomen: Soft, gravid, appropriate for gestational age. Pain/Pressure: Absent     Pelvic:  Cervical exam deferred        Extremities: Normal range of motion.  Edema: Trace  Mental Status: Normal mood and affect. Normal behavior. Normal judgment and thought content.   Urinalysis:      Assessment and Plan:  Pregnancy: G2P1011 at [redacted]w[redacted]d  1. Supervision of high risk pregnancy, antepartum Will need GBS swab on L&D  2. Gestational HTN D/w pt that she at least has gHTN and maybe pre-eclampsia but since over 37wks, recommend IOL, which she is amenable  to. L&D called and pt to go there after clinic visit.   3. GDMa1 Check BS on admission   Term labor symptoms and general obstetric precautions including but not limited to vaginal bleeding, contractions, leaking of fluid and fetal movement were reviewed in detail with the patient. Please refer to After Visit Summary for other counseling recommendations.  Return if symptoms worsen or fail to improve.   Combine Bing, MD

## 2016-04-23 NOTE — H&P (Signed)
LABOR AND DELIVERY ADMISSION HISTORY AND PHYSICAL NOTE  Taylor Harding is a 26 y.o. female G86P1011 with IUP at [redacted]w[redacted]d by [place] presenting for IOL secondary to [facility].   She reports positive fetal movement. She denies leakage of fluid or vaginal bleeding.  Prenatal History/Complications:  Past Medical History: Past Medical History:  Diagnosis Date  . Asthma   . Gestational diabetes     Past Surgical History: Past Surgical History:  Procedure Laterality Date  . NO PAST SURGERIES      Obstetrical History: OB History    Gravida Para Term Preterm AB Living   SAB TAB Ectopic Multiple Live Births   Social History: Social History   Social History  . Marital status: Single    Spouse name: N/A  . Number of children: N/A  . Years of education: N/A   Social History Main Topics  . Smoking status: Never Smoker  . Smokeless tobacco: Never Used  . Alcohol use No  . Drug use: No  . Sexual activity: Yes   Other Topics Concern  . None   Social History Narrative  . None    Family History: History reviewed. No pertinent family history.  Allergies: Allergies  Allergen Reactions  . Benadryl [Diphenhydramine Hcl (Sleep)] Anaphylaxis  . Latex Anaphylaxis    Prescriptions Prior to Admission  Medication Sig Dispense Refill Last Dose  . ACCU-CHEK FASTCLIX LANCETS MISC Check glucose 4 times daily 100 each 5 Taking  . albuterol (PROVENTIL HFA;VENTOLIN HFA) 108 (90 Base) MCG/ACT inhaler Inhale 2 puffs into the lungs every 6 (six) hours as needed for wheezing or shortness of breath.   Not Taking  . glucose blood (ACCU-CHEK GUIDE) test strip Check glucose 4 times daily  Use as instructed 100 each 5 Taking  . Prenatal Vit-Fe Fumarate-FA (PRENATAL MULTIVITAMIN) TABS tablet Take 1 tablet by mouth at bedtime.   Taking     Review of Systems   All systems reviewed and negative except as stated in HPI  Blood pressure 132/63, pulse 86, temperature 98.1 F  (36.7 C), temperature source Oral, resp. rate 18, height  (1.651 m), weight 158 lb (71.7 kg), last menstrual period 08/06/2015. General appearance: alert, cooperative and appears stated age Lungs: Normal effort, no audible wheezing Heart: regular rate, distal pulses palpable Abdomen: soft, non-tender; bowel sounds normal Extremities: No calf swelling or tenderness Presentation: cephalic per nurse/MAU exam Fetal monitoring: FHR 140, no decel, mod variability Uterine activity: Irregullar     Prenatal labs: ABO, Rh: A/Positive/-- (11/14 1608) Antibody: Negative (11/14 1608) Rubella: Immune RPR: Non Reactive (01/22 1044)  HBsAg: Negative (11/14 1608)  HIV: Non Reactive (01/22 1044)  GBS: Negative (01/31 0000)  1 hr Glucola: 195 Genetic screening:  Normal  Anatomy US: Normal   Prenatal Transfer Tool  Maternal Diabetes: Yes:  Diabetes Type:  Diet controlled Genetic Screening: Normal Maternal Ultrasounds/Referrals: Normal Fetal Ultrasounds or other Referrals:  None Maternal Substance Abuse:  No Significant Maternal Medications:  None Significant Maternal Lab Results: None  Results for orders placed or performed during the hospital encounter of 04/22/16 (from the past 24 hour(s))  Comprehensive metabolic panel   Collection Time: 04/22/16  4:32 PM  Result Value Ref Range   Sodium 135 135 - 145 mmol/L   Potassium 3.6 3.5 - 5.1 mmol/L   Chloride 105 101 - 111 mmol/L   CO2 20 (L)  22 - 32 mmol/L   Glucose, Bld 103 (H) 65 - 99 mg/dL   BUN 12 6 - 20 mg/dL   Creatinine, Ser 4.09 0.44 - 1.00 mg/dL   Calcium 8.7 (L) 8.9 - 10.3 mg/dL   Total Protein 6.5 6.5 - 8.1 g/dL   Albumin 3.1 (L) 3.5 - 5.0 g/dL   AST 18 15 - 41 U/L   ALT 12 (L) 14 - 54 U/L   Alkaline Phosphatase 112 38 - 126 U/L   Total Bilirubin 0.4 0.3 - 1.2 mg/dL   GFR calc non Af Amer >60 >60 mL/min   GFR calc Af Amer >60 >60 mL/min   Anion gap 10 5 - 15  CBC   Collection Time: 04/22/16  4:32 PM  Result Value Ref  Range   WBC 11.8 (H) 4.0 - 10.5 K/uL   RBC 3.67 (L) 3.87 - 5.11 MIL/uL   Hemoglobin 11.6 (L) 12.0 - 15.0 g/dL   HCT 81.1 (L) 91.4 - 78.2 %   MCV 94.0 78.0 - 100.0 fL   MCH 31.6 26.0 - 34.0 pg   MCHC 33.6 30.0 - 36.0 g/dL   RDW 95.6 21.3 - 08.6 %   Platelets 205 150 - 400 K/uL  Protein / creatinine ratio, urine   Collection Time: 04/22/16  5:30 PM  Result Value Ref Range   Creatinine, Urine 146.00 mg/dL   Total Protein, Urine 16 mg/dL   Protein Creatinine Ratio 0.11 0.00 - 0.15 mg/mg[Cre]    Patient Active Problem List   Diagnosis Date Noted  . Gestational hypertension 04/23/2016  . Normal labor 04/23/2016  . GDM (gestational diabetes mellitus) 03/04/2016  . Sebaceous cyst 02/12/2016  . Supervision of high risk pregnancy, antepartum 12/05/2015  . Late prenatal care affecting pregnancy in second trimester 12/05/2015  . H/O miscarriage, currently pregnant, second trimester 12/05/2015    Assessment: Taylor Harding is a 26 y.o. G2P1011 at [redacted]w[redacted]d here for IOL in the setting of GHTN. Patient did not require any medications for BP control throughout pregnancy. Patient reported dizziness and vision changes (floaters) consistent with hypertension. Patient was normotensive prior to pregnacy. CMP, CBC and protein/creatinine have been within normal limit. Patient also with history of Gestational diabetes that was controlled with diet.  #Labor: Foley bulb placed,  #Pain: IV pain meds and epidural  #FWB: Category 1 #ID:  GBS results pending #MOF: Breast #MOC: To be determined  #Circ:  N/a (girl)  Cardarius Senat 04/23/2016, 12:43 PM

## 2016-04-24 ENCOUNTER — Inpatient Hospital Stay (HOSPITAL_COMMUNITY): Payer: Medicaid Other | Admitting: Anesthesiology

## 2016-04-24 ENCOUNTER — Encounter (HOSPITAL_COMMUNITY): Payer: Self-pay | Admitting: Anesthesiology

## 2016-04-24 DIAGNOSIS — O134 Gestational [pregnancy-induced] hypertension without significant proteinuria, complicating childbirth: Secondary | ICD-10-CM

## 2016-04-24 DIAGNOSIS — O24429 Gestational diabetes mellitus in childbirth, unspecified control: Secondary | ICD-10-CM

## 2016-04-24 DIAGNOSIS — Z3A37 37 weeks gestation of pregnancy: Secondary | ICD-10-CM

## 2016-04-24 LAB — CBC
HCT: 30.3 % — ABNORMAL LOW (ref 36.0–46.0)
HEMOGLOBIN: 10.5 g/dL — AB (ref 12.0–15.0)
MCH: 32.5 pg (ref 26.0–34.0)
MCHC: 34.7 g/dL (ref 30.0–36.0)
MCV: 93.8 fL (ref 78.0–100.0)
PLATELETS: 145 10*3/uL — AB (ref 150–400)
RBC: 3.23 MIL/uL — ABNORMAL LOW (ref 3.87–5.11)
RDW: 12.9 % (ref 11.5–15.5)
WBC: 16.6 10*3/uL — ABNORMAL HIGH (ref 4.0–10.5)

## 2016-04-24 LAB — GLUCOSE, CAPILLARY
GLUCOSE-CAPILLARY: 90 mg/dL (ref 65–99)
Glucose-Capillary: 121 mg/dL — ABNORMAL HIGH (ref 65–99)

## 2016-04-24 LAB — RPR: RPR Ser Ql: NONREACTIVE

## 2016-04-24 LAB — ABO/RH: ABO/RH(D): A POS

## 2016-04-24 MED ORDER — SENNOSIDES-DOCUSATE SODIUM 8.6-50 MG PO TABS
2.0000 | ORAL_TABLET | ORAL | Status: DC
  Administered 2016-04-24 – 2016-04-25 (×2): 2 via ORAL
  Filled 2016-04-24 (×2): qty 2

## 2016-04-24 MED ORDER — DIBUCAINE 1 % RE OINT: 1.0000 "application " | TOPICAL_OINTMENT | RECTAL | Status: DC | PRN

## 2016-04-24 MED ORDER — OXYTOCIN 40 UNITS IN LACTATED RINGERS INFUSION - SIMPLE MED
1.0000 m[IU]/min | INTRAVENOUS | Status: DC
  Administered 2016-04-24: 1 m[IU]/min via INTRAVENOUS

## 2016-04-24 MED ORDER — ONDANSETRON HCL 4 MG PO TABS: 4.0000 mg | ORAL_TABLET | ORAL | Status: DC | PRN

## 2016-04-24 MED ORDER — PRENATAL MULTIVITAMIN CH
1.0000 | ORAL_TABLET | Freq: Every day | ORAL | Status: DC
  Administered 2016-04-24 – 2016-04-25 (×2): 1 via ORAL
  Filled 2016-04-24 (×2): qty 1

## 2016-04-24 MED ORDER — COCONUT OIL OIL
1.0000 "application " | TOPICAL_OIL | Status: DC | PRN
  Administered 2016-04-24: 1 via TOPICAL
  Filled 2016-04-24: qty 120

## 2016-04-24 MED ORDER — LACTATED RINGERS IV SOLN: INTRAVENOUS | Status: DC

## 2016-04-24 MED ORDER — ACETAMINOPHEN 325 MG PO TABS: 650.0000 mg | ORAL_TABLET | ORAL | Status: DC | PRN

## 2016-04-24 MED ORDER — TETANUS-DIPHTH-ACELL PERTUSSIS 5-2.5-18.5 LF-MCG/0.5 IM SUSP: 0.5000 mL | Freq: Once | INTRAMUSCULAR | Status: DC

## 2016-04-24 MED ORDER — BENZOCAINE-MENTHOL 20-0.5 % EX AERO
1.0000 "application " | INHALATION_SPRAY | CUTANEOUS | Status: DC | PRN
  Filled 2016-04-24: qty 56

## 2016-04-24 MED ORDER — ONDANSETRON HCL 4 MG/2ML IJ SOLN: 4.0000 mg | INTRAMUSCULAR | Status: DC | PRN

## 2016-04-24 MED ORDER — WITCH HAZEL-GLYCERIN EX PADS
1.0000 | MEDICATED_PAD | CUTANEOUS | Status: DC | PRN
Start: 2016-04-24 — End: 2016-04-26

## 2016-04-24 MED ORDER — LIDOCAINE HCL (PF) 1 % IJ SOLN
INTRAMUSCULAR | Status: DC | PRN
  Administered 2016-04-24 (×2): 4 mL via EPIDURAL

## 2016-04-24 MED ORDER — ZOLPIDEM TARTRATE 5 MG PO TABS: 5.0000 mg | ORAL_TABLET | Freq: Every evening | ORAL | Status: DC | PRN

## 2016-04-24 MED ORDER — IBUPROFEN 600 MG PO TABS
600.0000 mg | ORAL_TABLET | Freq: Four times a day (QID) | ORAL | Status: DC
  Administered 2016-04-24 – 2016-04-25 (×7): 600 mg via ORAL
  Filled 2016-04-24 (×8): qty 1

## 2016-04-24 MED ORDER — SIMETHICONE 80 MG PO CHEW: 80.0000 mg | CHEWABLE_TABLET | ORAL | Status: DC | PRN

## 2016-04-24 NOTE — Progress Notes (Signed)
Steffanie Rainwater notified that there were no CBG ordered for patient. See orders.

## 2016-04-24 NOTE — Progress Notes (Signed)
Patient ID: Taylor Harding, female   DOB: [birth year], 26 y.o.   MRN: [id number]  S: Patient seen & examined for progress of labor. Patient comfortable with epidural, oxygen ma.    O:  04/24/16 0133   71    102/52   04/24/16 0100   63    116/58     Dilation: 5 Effacement (%): 70 Cervical Position: Posterior Station: -3 Presentation: Vertex Exam by:: Rolene Arbour, RN   FHT: 115bpm, min to mod var, no accels, repetitive late decels  TOCO: q1-9min   A/P: Stopped pitocin  Oxygen mask AROM performed, clear fluid with about an orange size clot and some dark red blood returned with fluid IUPC placed FSE placed Lates stopped after pitocin turned off Continue expectant management Anticipate SVD

## 2016-04-24 NOTE — Anesthesia Preprocedure Evaluation (Signed)
Anesthesia Evaluation  Patient identified by MRN, date of birth, ID band Patient awake    Reviewed: Allergy & Precautions, Patient's Chart, lab work & pertinent test results  Airway Mallampati: III       Dental no notable dental hx. (+) Teeth Intact   Pulmonary asthma ,    Pulmonary exam normal breath sounds clear to auscultation       Cardiovascular hypertension, Normal cardiovascular exam Rhythm:Regular Rate:Normal     Neuro/Psych negative neurological ROS  negative psych ROS   GI/Hepatic negative GI ROS, Neg liver ROS,   Endo/Other  diabetes, Well Controlled, Gestational  Renal/GU negative Renal ROS  negative genitourinary   Musculoskeletal negative musculoskeletal ROS (+)   Abdominal   Peds  Hematology  (+) anemia ,   Anesthesia Other Findings   Reproductive/Obstetrics (+) Pregnancy Gestational HTN                             Anesthesia Physical Anesthesia Plan  ASA: II  Anesthesia Plan: Epidural   Post-op Pain Management:    Induction:   Airway Management Planned: Natural Airway  Additional Equipment:   Intra-op Plan:   Post-operative Plan:   Informed Consent: I have reviewed the patients History and Physical, chart, labs and discussed the procedure including the risks, benefits and alternatives for the proposed anesthesia with the patient or authorized representative who has indicated his/her understanding and acceptance.     Plan Discussed with: Anesthesiologist  Anesthesia Plan Comments:         Anesthesia Quick Evaluation

## 2016-04-24 NOTE — Progress Notes (Signed)
Amber from L&D called for epidural removal.

## 2016-04-24 NOTE — Progress Notes (Signed)
Patient ID: Carylon Perches, female   DOB: 06-Sep-1990, 26 y.o.   MRN: [id number]  S: Patient seen & examined for progress of labor. Patient comfortable with epidural. Called to bedside due to repetitive variable decels/prolonged.  Patient has had position changes and oxygen.    O:  Vitals:   04/24/16 0245 04/24/16 0305 04/24/16 0330 04/24/16 0400  BP: (!) 107/57 (!) 113/48 (!) 95/40 (!) 112/52  Pulse: 66 75 64 74  Resp:      Temp:      TempSrc:      Weight:      Height:        Dilation: 6 Effacement (%): 80 Cervical Position: Middle Station: -1 Presentation: Vertex Exam by:: Jobina Maita, DO   FHT: 115bpm, mod var, no accels, variable decels with every contraction with occasional prolonged decels (nadir to 70bpm lasting 2-3 min)  IUPC;  q2-16min; MVU <200, inadequate   A/P: Terbutaline given Amnioinfusion started Continue expectant management, guarded

## 2016-04-24 NOTE — Progress Notes (Signed)
Labor Progress Note Azarie Pujol is a 26 y.o. G2P1011 at [redacted]w[redacted]d presented for IOL due gHTN.  S: reports feeling more pressure. No headache, vision change.   O:  BP 136/71   Pulse 93   Temp 98.1 F (36.7 C) (Oral)   Resp 18   Ht  (1.651 m)   Wt 158 lb (71.7 kg)   LMP 08/06/2015 (Exact Date) Comment: double shielded, spoke to MD to verify if xray needed.  BMI 26.29 kg/m  EFM: 120-130/mod var/pos accels/ couple of late decels about 30 min and one hour ago.   CVE: Dilation: Lip/rim Effacement (%): 80 Cervical Position: Anterior Station: 0 Presentation: Vertex Exam by:: Rolene Arbour, RN   A&P: 26 y.o. G2P1011 [redacted]w[redacted]d here for IOL for gHTN #gHTN: BP within normal range. Pre-E labs normal #A1gDM: CBG within normal limit. Will continue monitoring #Labor: progressing well.  #Pain: Epidural #FWB: CAT-1 except for a couple of late looking decels about 30 minutes apart (30 minutes and one hour ago). No further variables. Overall, reassuring strip #GBS: neg  Almon Hercules, MD 5:41 AM

## 2016-04-24 NOTE — Lactation Note (Signed)
This note was copied from a baby's chart. Lactation Consultation Note  Patient Name: Girl Francene Mcerlean Today's Date: 04/24/2016 Reason for consult: Initial assessment Baby at 10 hr of life. Upon entry baby was sts with mom. Mom was able to easily latch baby. She is reporting burning nipples "just for a few minutes when she is done". No skin break down or bruising noted. RN to give coconut oil. Discussed baby behavior, feeding frequency, baby belly size, voids, wt loss, breast changes, and nipple care. Demonstrated manual expression, colostrum noted bilaterally, spoon at bedside. Given lactation handouts. Aware of OP services and support group. Mom will offer the breat on demand q3hr, post express, and spoon feed per volume guidelines.       Maternal Data Has patient been taught Hand Expression?: Yes Does the patient have breastfeeding experience prior to this delivery?: Yes  Feeding Feeding Type: Breast Fed Length of feed: 20 min  LATCH Score/Interventions                      Lactation Tools Discussed/Used WIC Program: Yes   Consult Status Consult Status: Follow-up Date: 04/25/16 Follow-up type: In-patient    Rulon Eisenmenger 04/24/2016, 5:24 PM

## 2016-04-24 NOTE — Anesthesia Procedure Notes (Signed)
Epidural Patient location during procedure: OB Start time: 04/24/2016 12:19 AM  Staffing Anesthesiologist: Mal Amabile Performed: anesthesiologist   Preanesthetic Checklist Completed: patient identified, site marked, surgical consent, pre-op evaluation, timeout performed, IV checked, risks and benefits discussed and monitors and equipment checked  Epidural Patient position: sitting Prep: site prepped and draped and DuraPrep Patient monitoring: continuous pulse ox and blood pressure Approach: midline Location: L3-L4 Injection technique: LOR air  Needle:  Needle type: Tuohy  Needle gauge: 17 G Needle length: 9 cm and 9 Needle insertion depth: 4 cm Catheter type: closed end flexible Catheter size: 19 Gauge Catheter at skin depth: 9 cm Test dose: negative and Other  Assessment Events: blood not aspirated, injection not painful, no injection resistance, negative IV test and no paresthesia  Additional Notes Patient identified. Risks and benefits discussed including failed block, incomplete  Pain control, post dural puncture headache, nerve damage, paralysis, blood pressure Changes, nausea, vomiting, reactions to medications-both toxic and allergic and post Partum back pain. All questions were answered. Patient expressed understanding and wished to proceed. Sterile technique was used throughout procedure. Epidural site was Dressed with sterile barrier dressing. No paresthesias, signs of intravascular injection Or signs of intrathecal spread were encountered.  Patient was more comfortable after the epidural was dosed. Please see RN's note for documentation of vital signs and FHR which are stable.

## 2016-04-24 NOTE — Anesthesia Postprocedure Evaluation (Signed)
Anesthesia Post Note  Patient: Taylor Harding  Procedure(s) Performed: * No procedures listed *  Patient location during evaluation: Mother Baby Anesthesia Type: Epidural Level of consciousness: awake, awake and alert, oriented and patient cooperative Pain management: pain level controlled Vital Signs Assessment: post-procedure vital signs reviewed and stable Respiratory status: spontaneous breathing, nonlabored ventilation and respiratory function stable Cardiovascular status: stable Postop Assessment: no headache, no backache, patient able to bend at knees and no signs of nausea or vomiting Anesthetic complications: no        Last Vitals:  Vitals:   04/24/16 0930 04/24/16 1303  BP: 135/68 105/63  Pulse: 83 75  Resp: 18 18  Temp: 36.8 C 36.8 C    Last Pain:  Vitals:   04/24/16 0930  TempSrc: Oral  PainSc: 0-No pain   Pain Goal:                 Linder Prajapati L

## 2016-04-24 NOTE — Progress Notes (Signed)
Labor Progress Note Taylor Harding is a 26 y.o. G2P1011 at [redacted]w[redacted]d presented for IOL due gHTN.  S: Went to evaluate patient after a couple of late decels back to back that has resolved with positioning and oxygen.       Patient is lying comfortable. She denies headache, vision changes or shortness of breath.   O:  BP 114/75   Pulse 71   Temp 98.1 F (36.7 C) (Oral)   Resp 17   Ht  (1.651 m)   Wt 158 lb (71.7 kg)   LMP 08/06/2015 (Exact Date) Comment: double shielded, spoke to MD to verify if xray needed.  BMI 26.29 kg/m  EFM: 120/mod var/pos accels/ couple of late decels (resolved)  CVE: Dilation: 5 Effacement (%): 60 Cervical Position: Posterior Station: -3 Presentation: Vertex Exam by:: Mumaw, MD   A&P: 26 y.o. G2P1011 [redacted]w[redacted]d here for IOL for gHTN #gHTN: BP within normal range. Pre-E labs normal #A1gDM: CBG within normal limit. Will continue monitoring #Labor: patient had couple of decels about 1:08 am that has resolved with positioning and oxygen. Discontinued pitocin. AROM and IUPC with fetal head electrode placed by Dr. Omer Jack for better monitoring. Noted about orange size blood clot during cervical exam. #Pain: Epidural #FWB: CAT-2 #GBS: neg  Almon Hercules, MD 2:19 AM

## 2016-04-25 LAB — CBC
HEMATOCRIT: 29.8 % — AB (ref 36.0–46.0)
Hemoglobin: 10.4 g/dL — ABNORMAL LOW (ref 12.0–15.0)
MCH: 33 pg (ref 26.0–34.0)
MCHC: 34.9 g/dL (ref 30.0–36.0)
MCV: 94.6 fL (ref 78.0–100.0)
Platelets: 152 10*3/uL (ref 150–400)
RBC: 3.15 MIL/uL — ABNORMAL LOW (ref 3.87–5.11)
RDW: 13.1 % (ref 11.5–15.5)
WBC: 12.4 10*3/uL — AB (ref 4.0–10.5)

## 2016-04-25 LAB — GLUCOSE, CAPILLARY: GLUCOSE-CAPILLARY: 73 mg/dL (ref 65–99)

## 2016-04-25 MED ORDER — FERROUS SULFATE 325 (65 FE) MG PO TABS
325.0000 mg | ORAL_TABLET | Freq: Three times a day (TID) | ORAL | Status: DC
  Administered 2016-04-25 – 2016-04-26 (×3): 325 mg via ORAL
  Filled 2016-04-25 (×3): qty 1

## 2016-04-25 NOTE — Lactation Note (Signed)
This note was copied from a baby's chart. Lactation Consultation Note  Patient Name: Taylor Harding [id number] Date: 04/25/2016 Reason for consult: Follow-up assessment  Baby is 34 hours old and has been consistently to the breast.  Per mom nipples sore with latching. The left nipple has a positional strip, and areola semi compressible with some edema , the right is clear, more compressible.  @ this consult worked on depth , positioning, hand expressing, and reviewed basics.  Baby latched both with depth and per mom felt more comfortable.  Multiple swallows noted.  LC instructed mom the use shells , hand pump.  See doc flow sheets.      Maternal Data Has patient been taught Hand Expression?: Yes  Feeding Feeding Type: Breast Fed Length of feed: 10 min  LATCH Score/Interventions Latch: Grasps breast easily, tongue down, lips flanged, rhythmical sucking. Intervention(s): Adjust position;Assist with latch;Breast massage;Breast compression  Audible Swallowing: Spontaneous and intermittent  Type of Nipple: Everted at rest and after stimulation  Comfort (Breast/Nipple): Soft / non-tender  Problem noted: Mild/Moderate discomfort Interventions (Mild/moderate discomfort): Pre-pump if needed (hand pump and shells given)  Hold (Positioning): Assistance needed to correctly position infant at breast and maintain latch. Intervention(s): Breastfeeding basics reviewed;Support Pillows;Position options;Skin to skin  LATCH Score: 9  Lactation Tools Discussed/Used Tools: Shells;Pump Shell Type: Inverted Breast pump type: Manual   Consult Status Consult Status: Follow-up Date: 04/26/16 Follow-up type: In-patient    Matilde Sprang Ravonda Brecheen 04/25/2016, 3:45 PM

## 2016-04-25 NOTE — Progress Notes (Signed)
Post Partum Day #1 Subjective: no complaints, up ad lib, voiding and tolerating PO  Objective: Blood pressure 123/70, pulse 62, temperature 98.5 F (36.9 C), temperature source Oral, resp. rate 18, height  (1.651 m), weight 158 lb (71.7 kg), last menstrual period 08/06/2015, SpO2 99 %, unknown if currently breastfeeding.  Physical Exam:  General: alert, cooperative and no distress Lochia: appropriate Uterine Fundus: firm Incision: none DVT Evaluation: No evidence of DVT seen on physical exam. No cords or calf tenderness. No significant calf/ankle edema.   Recent Labs  04/24/16 0738 04/25/16 0518  HGB 10.5* 10.4*  HCT 30.3* 29.8*    Assessment/Plan: Plan for discharge tomorrow and Contraception declines for now [place] [place] [facility].  Anemia: VSS, asymptomatic, iron started.  CBGs stable.    LOS: 2 days   Roe Coombs, CNM 04/25/2016, 7:55 AM

## 2016-04-26 ENCOUNTER — Telehealth: Payer: Self-pay

## 2016-04-26 MED ORDER — IBUPROFEN 600 MG PO TABS: 600.0000 mg | ORAL_TABLET | Freq: Four times a day (QID) | ORAL | 0 refills | Status: AC

## 2016-04-26 MED ORDER — DOCUSATE SODIUM 100 MG PO CAPS: 100.0000 mg | ORAL_CAPSULE | Freq: Two times a day (BID) | ORAL | 0 refills | Status: AC

## 2016-04-26 NOTE — Telephone Encounter (Signed)
Contacted patient and advised of need of BP check appt next week, patient agreed, transferred to scheduler.

## 2016-04-26 NOTE — Progress Notes (Signed)
Informed pt of MD order for [facility] RN to visit for BP check on days #3 and #7. Spoke with Alma Downs with [facility]. Pt is a [facility] resident. Phone number for [facility] received. [phone]). Instructed pt to call number as soon as possible for BP check tomorrow (day #3). Day #7 is 05/01/16 and pt has MD visit that day. Pt expressed understanding. Reinforced at discharge when pt was wheeled to car via wheelchair with newborn.

## 2016-04-26 NOTE — Lactation Note (Signed)
This note was copied from a baby's chart. Lactation Consultation Note  Patient Name: Taylor Harding ZOXWR'U Date: 04/26/2016 Reason for consult: Follow-up assessment;Infant weight loss (6% weight loss ) Baby is 53 hours , mom has been exclusively breast feeding.  Per mom breast are fuller than yesterday, but not engorged, nipples sensitive.  LC instructed on use shells , and hand pump yesterday. And today comfort gels x6 days.  LC recommended to mom to use EBM to nipples liberally, and after feeding comfort gels, when warm , rinse with water, place in the refrigerator , and use the breast shells.  LC reminded mom firm support , and not allowing the baby to latch herself, also to use breast compressions until the baby is swallowing , and then intermittent.  Nutritive vs non - nutritive feeding patterns, and watch for hanging out.  Sore nipple and engorgement prevention and tx reviewed.  LC reviewed basics and answered moms questions.  Mom seemed more confident today, and pleased with how breast feeding is going.  LC reminded mom if she is still having soreness at 4 days to call for [facility] O/P appt.  To have a feeding assessment. Mom receptive.  Mother informed of post-discharge support and given phone number to the lactation department, including services for phone call assistance; out-patient appointments; and breastfeeding support group. List of other breastfeeding resources in the community given in the handout. Encouraged mother to call for problems or concerns related to breastfeeding.    Maternal Data Has patient been taught Hand Expression?: Yes  Feeding ( latch below was done by the [facility] RN ) prior to the [facility] consult  Feeding Type:  (per mom baby fed last at 915 ) Length of feed: 10 min (per mom re- latch )  LATCH Score/Interventions Latch: Grasps breast easily, tongue down, lips flanged, rhythmical sucking. Intervention(s): Adjust position;Assist with latch  Audible Swallowing:  Spontaneous and intermittent Intervention(s): Hand expression  Type of Nipple: Everted at rest and after stimulation  Comfort (Breast/Nipple): Engorged, cracked, bleeding, large blisters, severe discomfort Problem noted: Cracked, bleeding, blisters, bruises Intervention(s): Expressed breast milk to nipple;Hand pump  Problem noted: Filling Interventions (Filling): Frequent nursing;Hand pump;Reverse pressure  Hold (Positioning): Assistance needed to correctly position infant at breast and maintain latch. Intervention(s): Breastfeeding basics reviewed  LATCH Score: 7  Lactation Tools Discussed/Used Tools: Shells;Pump Shell Type: Inverted Breast pump type: Manual WIC Program: Yes (per mom [place] ) Pump Review: Milk Storage   Consult Status Consult Status: Complete Date: 04/26/16    Kathrin Greathouse 04/26/2016, 11:34 AM

## 2016-04-26 NOTE — Discharge Instructions (Signed)
Vaginal Delivery, Care After °Refer to this sheet in the next few weeks. These instructions provide you with information about caring for yourself after vaginal delivery. Your health care provider may also give you more specific instructions. Your treatment has been planned according to current medical practices, but problems sometimes occur. Call your health care provider if you have any problems or questions. °What can I expect after the procedure? °After vaginal delivery, it is common to have: °· Some bleeding from your vagina. °· Soreness in your abdomen, your vagina, and the area of skin between your vaginal opening and your anus (perineum). °· Pelvic cramps. °· Fatigue. °Follow these instructions at home: °Medicines  °· Take over-the-counter and prescription medicines only as told by your health care provider. °· If you were prescribed an antibiotic medicine, take it as told by your health care provider. Do not stop taking the antibiotic until it is finished. °Driving  ° °· Do not drive or operate heavy machinery while taking prescription pain medicine. °· Do not drive for 24 hours if you received a sedative. °Lifestyle  °· Do not drink alcohol. This is especially important if you are breastfeeding or taking medicine to relieve pain. °· Do not use tobacco products, including cigarettes, chewing tobacco, or e-cigarettes. If you need help quitting, ask your health care provider. °Eating and drinking  °· Drink at least 8 eight-ounce glasses of water every day unless you are told not to by your health care provider. If you choose to breastfeed your baby, you may need to drink more water than this. °· Eat high-fiber foods every day. These foods may help prevent or relieve constipation. High-fiber foods include: °¨ Whole grain cereals and breads. °¨ Brown rice. °¨ Beans. °¨ Fresh fruits and vegetables. °Activity  °· Return to your normal activities as told by your health care provider. Ask your health care provider  what activities are safe for you. °· Rest as much as possible. Try to rest or take a nap when your baby is sleeping. °· Do not lift anything that is heavier than your baby or 10 lb (4.5 kg) until your health care provider says that it is safe. °· Talk with your health care provider about when you can engage in sexual activity. This may depend on your: °¨ Risk of infection. °¨ Rate of healing. °¨ Comfort and desire to engage in sexual activity. °Vaginal Care  °· If you have an episiotomy or a vaginal tear, check the area every day for signs of infection. Check for: °¨ More redness, swelling, or pain. °¨ More fluid or blood. °¨ Warmth. °¨ Pus or a bad smell. °· Do not use tampons or douches until your health care provider says this is safe. °· Watch for any blood clots that may pass from your vagina. These may look like clumps of dark red, brown, or black discharge. °General instructions  °· Keep your perineum clean and dry as told by your health care provider. °· Wear loose, comfortable clothing. °· Wipe from front to back when you use the toilet. °· Ask your health care provider if you can shower or take a bath. If you had an episiotomy or a perineal tear during labor and delivery, your health care provider may tell you not to take baths for a certain length of time. °· Wear a bra that supports your breasts and fits you well. °· If possible, have someone help you with household activities and help care for your baby for   at least a few days after you leave the hospital. °· Keep all follow-up visits for you and your baby as told by your health care provider. This is important. °Contact a health care provider if: °· You have: °¨ Vaginal discharge that has a bad smell. °¨ Difficulty urinating. °¨ Pain when urinating. °¨ A sudden increase or decrease in the frequency of your bowel movements. °¨ More redness, swelling, or pain around your episiotomy or vaginal tear. °¨ More fluid or blood coming from your episiotomy or  vaginal tear. °¨ Pus or a bad smell coming from your episiotomy or vaginal tear. °¨ A fever. °¨ A rash. °¨ Little or no interest in activities you used to enjoy. °¨ Questions about caring for yourself or your baby. °· Your episiotomy or vaginal tear feels warm to the touch. °· Your episiotomy or vaginal tear is separating or does not appear to be healing. °· Your breasts are painful, hard, or turn red. °· You feel unusually sad or worried. °· You feel nauseous or you vomit. °· You pass large blood clots from your vagina. If you pass a blood clot from your vagina, save it to show to your health care provider. Do not flush blood clots down the toilet without having your health care provider look at them. °· You urinate more than usual. °· You are dizzy or light-headed. °· You have not breastfed at all and you have not had a menstrual period for 12 weeks after delivery. °· You have stopped breastfeeding and you have not had a menstrual period for 12 weeks after you stopped breastfeeding. °Get help right away if: °· You have: °¨ Pain that does not go away or does not get better with medicine. °¨ Chest pain. °¨ Difficulty breathing. °¨ Blurred vision or spots in your vision. °¨ Thoughts about hurting yourself or your baby. °· You develop pain in your abdomen or in one of your legs. °· You develop a severe headache. °· You faint. °· You bleed from your vagina so much that you fill two sanitary pads in one hour. °This information is not intended to replace advice given to you by your health care provider. Make sure you discuss any questions you have with your health care provider. °Document Released: 01/05/2000 Document Revised: 06/21/2015 Document Reviewed: 01/22/2015 °Elsevier Interactive Patient Education © 2017 [employer] ° ° °Preeclampsia and Eclampsia °Preeclampsia is a serious condition that develops only during pregnancy. It is also called toxemia of pregnancy. This condition causes high blood pressure along  with other symptoms, such as swelling and headaches. These symptoms may develop as the condition gets worse. Preeclampsia may occur at 20 weeks of pregnancy or later. °Diagnosing and treating preeclampsia early is very important. If not treated early, it can cause serious problems for you and your baby. One problem it can lead to is eclampsia, which is a condition that causes muscle jerking or shaking (convulsions or seizures) in the mother. Delivering your baby is the best treatment for preeclampsia or eclampsia. Preeclampsia and eclampsia symptoms usually go away after your baby is born. °What are the causes? °The cause of preeclampsia is not known. °What increases the risk? °The following risk factors make you more likely to develop preeclampsia: °· Being pregnant for the first time. °· Having had preeclampsia during a past pregnancy. °· Having a family history of preeclampsia. °· Having high blood pressure. °· Being pregnant with twins or triplets. °· Being 35 or older. °· Being African-American. °·   Having kidney disease or diabetes. °· Having medical conditions such as lupus or blood diseases. °· Being very overweight (obese). °What are the signs or symptoms? °The earliest signs of preeclampsia are: °· High blood pressure. °· Increased protein in your urine. Your health care provider will check for this at every visit before you give birth (prenatal visit). °Other symptoms that may develop as the condition gets worse include: °· Severe headaches. °· Sudden weight gain. °· Swelling of the hands, face, legs, and feet. °· Nausea and vomiting. °· Vision problems, such as blurred or double vision. °· Numbness in the face, arms, legs, and feet. °· Urinating less than usual. °· Dizziness. °· Slurred speech. °· Abdominal pain, especially upper abdominal pain. °· Convulsions or seizures. °Symptoms generally go away after giving birth. °How is this diagnosed? °There are no screening tests for preeclampsia. Your health  care provider will ask you about symptoms and check for signs of preeclampsia during your prenatal visits. You may also have tests that include: °· Urine tests. °· Blood tests. °· Checking your blood pressure. °· Monitoring your baby’s heart rate. °· Ultrasound. °How is this treated? °You and your health care provider will determine the treatment approach that is best for you. Treatment may include: °· Having more frequent prenatal exams to check for signs of preeclampsia, if you have an increased risk for preeclampsia. °· Bed rest. °· Reducing how much salt (sodium) you eat. °· Medicine to lower your blood pressure. °· Staying in the hospital, if your condition is severe. There, treatment will focus on controlling your blood pressure and the amount of fluids in your body (fluid retention). °· You may need to take medicine (magnesium sulfate) to prevent seizures. This medicine may be given as an injection or through an IV tube. °· Delivering your baby early, if your condition gets worse. You may have your labor started with medicine (induced), or you may have a cesarean delivery. °Follow these instructions at home: °Eating and drinking  ° °· Drink enough fluid to keep your urine clear or pale yellow. °· Eat a healthy diet that is low in sodium. Do not add salt to your food. Check nutrition labels to see how much sodium a food or beverage contains. °· Avoid caffeine. °Lifestyle  °· Do not use any products that contain nicotine or tobacco, such as cigarettes and e-cigarettes. If you need help quitting, ask your health care provider. °· Do not use alcohol or drugs. °· Avoid stress as much as possible. Rest and get plenty of sleep. °General instructions  °· Take over-the-counter and prescription medicines only as told by your health care provider. °· When lying down, lie on your side. This keeps pressure off of your baby. °· When sitting or lying down, raise (elevate) your feet. Try putting some pillows underneath your  lower legs. °· Exercise regularly. Ask your health care provider what kinds of exercise are best for you. °· Keep all follow-up and prenatal visits as told by your health care provider. This is important. °How is this prevented? °To prevent preeclampsia or eclampsia from developing during another pregnancy: °· Get proper medical care during pregnancy. Your health care provider may be able to prevent preeclampsia or diagnose and treat it early. °· Your health care provider may have you take a low-dose aspirin or a calcium supplement during your next pregnancy. °· You may have tests of your blood pressure and kidney function after giving birth. °· Maintain a healthy weight. Ask your   health care provider for help managing weight gain during pregnancy. °· Work with your health care provider to manage any long-term (chronic) health conditions you have, such as diabetes or kidney problems. °Contact a health care provider if: °· You gain more weight than expected. °· You have headaches. °· You have nausea or vomiting. °· You have abdominal pain. °· You feel dizzy or light-headed. °Get help right away if: °· You develop sudden or severe swelling anywhere in your body. This usually happens in the legs. °· You gain 5 lbs (2.3 kg) or more during one week. °· You have severe: °¨ Abdominal pain. °¨ Headaches. °¨ Dizziness. °¨ Vision problems. °¨ Confusion. °¨ Nausea or vomiting. °· You have a seizure. °· You have trouble moving any part of your body. °· You develop numbness in any part of your body. °· You have trouble speaking. °· You have any abnormal bleeding. °· You pass out. °This information is not intended to replace advice given to you by your health care provider. Make sure you discuss any questions you have with your health care provider. °Document Released: 01/05/2000 Document Revised: 09/05/2015 Document Reviewed: 08/14/2015 °Elsevier Interactive Patient Education © 2017 [employer] ° °

## 2016-04-26 NOTE — Discharge Summary (Signed)
OB Discharge Summary     Patient Name: Taylor Harding DOB: 11-24-1990 MRN: [id number]  Date of admission: 04/23/2016 Delivering MD: Michaele Offer   Date of discharge: 04/26/2016  Admitting diagnosis: INDUCTION Intrauterine pregnancy: [redacted]w[redacted]d     Secondary diagnosis:  Principal Problem:   SVD (spontaneous vaginal delivery) Active Problems:   GDM (gestational diabetes mellitus)   Gestational hypertension   Normal labor  Additional problems: None     Discharge diagnosis: Term Pregnancy Delivered, Gestational Hypertension and GDM A1                                                                                                Post partum procedures:None  Augmentation: AROM, Cytotec and Foley Balloon  Complications: None  Hospital course:  Induction of Labor With Vaginal Delivery   26 y.o. yo [id number] at [redacted]w[redacted]d was admitted to the hospital 04/23/2016 for induction of labor.  Indication for induction: Gestational hypertension.  Patient had an uncomplicated labor course as follows: Membrane Rupture Time/Date: 1:37 AM ,04/24/2016   Intrapartum Procedures: Episiotomy: None [1]                                         Lacerations:  None [1]  Patient had delivery of a Viable infant.  Information for the patient's newborn:  [place], Leedy [[id number]]  Delivery Method: Vaginal, Spontaneous Delivery (Filed from Delivery Summary)   04/24/2016  Details of delivery can be found in separate delivery note.  Patient had a routine postpartum course. Patient is ambulating, tolerating PO intake, urinating, has had flatus and BM. She is breastfeeding. Patient is discharged home 04/26/16.  Physical exam  Vitals:   04/24/16 2100 04/25/16 0516 04/25/16 1904 04/26/16 0500  BP: 110/64 123/70 129/87 128/70  Pulse: 80 62 72 73  Resp: Temp: 98.3 F (36.8 C) 98.5 F (36.9 C) 98.3 F (36.8 C) 98.6 F (37 C)  TempSrc: Oral Oral Oral Oral  SpO2: 99%     Weight:      Height:        General: alert, cooperative and no distress Lochia: appropriate Uterine Fundus: firm Incision: N/A DVT Evaluation: No evidence of DVT seen on physical exam. Negative Homan's sign. No cords or calf tenderness. Labs: Lab Results  Component Value Date   WBC 12.4 (H) 04/25/2016   HGB 10.4 (L) 04/25/2016   HCT 29.8 (L) 04/25/2016   MCV 94.6 04/25/2016   PLT 152 04/25/2016   CMP Latest Ref Rng & Units 04/23/2016  Glucose 65 - 99 mg/dL 130(Q)  BUN 6 - 20 mg/dL 11  Creatinine 6.57 - 8.46 mg/dL 9.62  Sodium 952 - 841 mmol/L 135  Potassium 3.5 - 5.1 mmol/L 4.1  Chloride 101 - 111 mmol/L 106  CO2 22 - 32 mmol/L 22  Calcium 8.9 - 10.3 mg/dL 8.9  Total Protein 6.5 - 8.1 g/dL 6.3(L)  Total Bilirubin 0.3 - 1.2 mg/dL 0.3  Alkaline Phos 38 - 126 U/L 118  AST 15 - 41 U/L 19  ALT 14 - 54 U/L 14    Discharge instruction: per After Visit Summary and "Baby and Me Booklet".  After visit meds:  Allergies as of 04/26/2016      Reactions   Benadryl [diphenhydramine Hcl (sleep)] Anaphylaxis   Latex Anaphylaxis      Medication List    STOP taking these medications   ACCU-CHEK FASTCLIX LANCETS Misc   glucose blood test strip Commonly known as:  ACCU-CHEK GUIDE     TAKE these medications   albuterol 108 (90 Base) MCG/ACT inhaler Commonly known as:  PROVENTIL HFA;VENTOLIN HFA Inhale 2 puffs into the lungs every 6 (six) hours as needed for wheezing or shortness of breath.   docusate sodium 100 MG capsule Commonly known as:  COLACE Take 1 capsule (100 mg total) by mouth 2 (two) times daily.   ibuprofen 600 MG tablet Commonly known as:  ADVIL,MOTRIN Take 1 tablet (600 mg total) by mouth every 6 (six) hours.   prenatal multivitamin Tabs tablet Take 1 tablet by mouth at bedtime.       Diet: low salt diet  Activity: Advance as tolerated. Pelvic rest for 6 weeks.   Outpatient follow up:1 week for BP check and 6 weeks for postpartum visit Follow up Appt:No future  appointments. Follow up Visit:No Follow-up on file.  Postpartum contraception: Depo Provera  Newborn Data: Live born female  Birth Weight: 6 lb 4.2 oz (2840 g) APGAR: 7, 9  Baby Feeding: Breast Disposition:home with mother   04/26/2016 Jen Mow, DO OB fellow

## 2016-05-01 ENCOUNTER — Ambulatory Visit: Payer: Medicaid Other

## 2016-05-01 VITALS — BP 111/71 | HR 80 | Wt 140.2 lb

## 2016-05-01 DIAGNOSIS — Z0130 Encounter for examination of blood pressure without abnormal findings: Secondary | ICD-10-CM

## 2016-05-01 NOTE — Progress Notes (Signed)
Patient is in the office for BP check. BP 111/71, patient states that she no longer has headahes, dizziness and denies seeing spots. Advised to follow up in 6 weeks for pp visit.

## 2016-05-02 ENCOUNTER — Encounter: Payer: Medicaid Other | Admitting: Obstetrics & Gynecology

## 2016-05-02 ENCOUNTER — Encounter: Payer: Medicaid Other | Admitting: Certified Nurse Midwife

## 2016-05-07 ENCOUNTER — Telehealth: Payer: Self-pay

## 2016-05-07 NOTE — Telephone Encounter (Addendum)
TC from pt SVD on 04/24/16 c/o not able to keep down foods nor fluids and c/o back pain from epidural per pt. Consulted with Dr. Clearance Coots. Pt advised to report to [facility] to r/o dehydration, possible IV fluids and to take IB 600 mg q 6 hrs prn for the back pain. If pain does not subside after taking IB, to contact the office for an appt. Pt agrees and has no further questions.

## 2016-06-05 ENCOUNTER — Ambulatory Visit: Payer: Medicaid Other | Admitting: Obstetrics and Gynecology

## 2016-06-05 ENCOUNTER — Other Ambulatory Visit: Payer: Medicaid Other

## 2018-04-05 IMAGING — CR DG CHEST 1V
1 series · 1 of 1 positions shown · non-contrast
Comparison: None.

CLINICAL DATA: Hematemesis

EXAM:
CHEST 1 VIEW

[w chest pa]
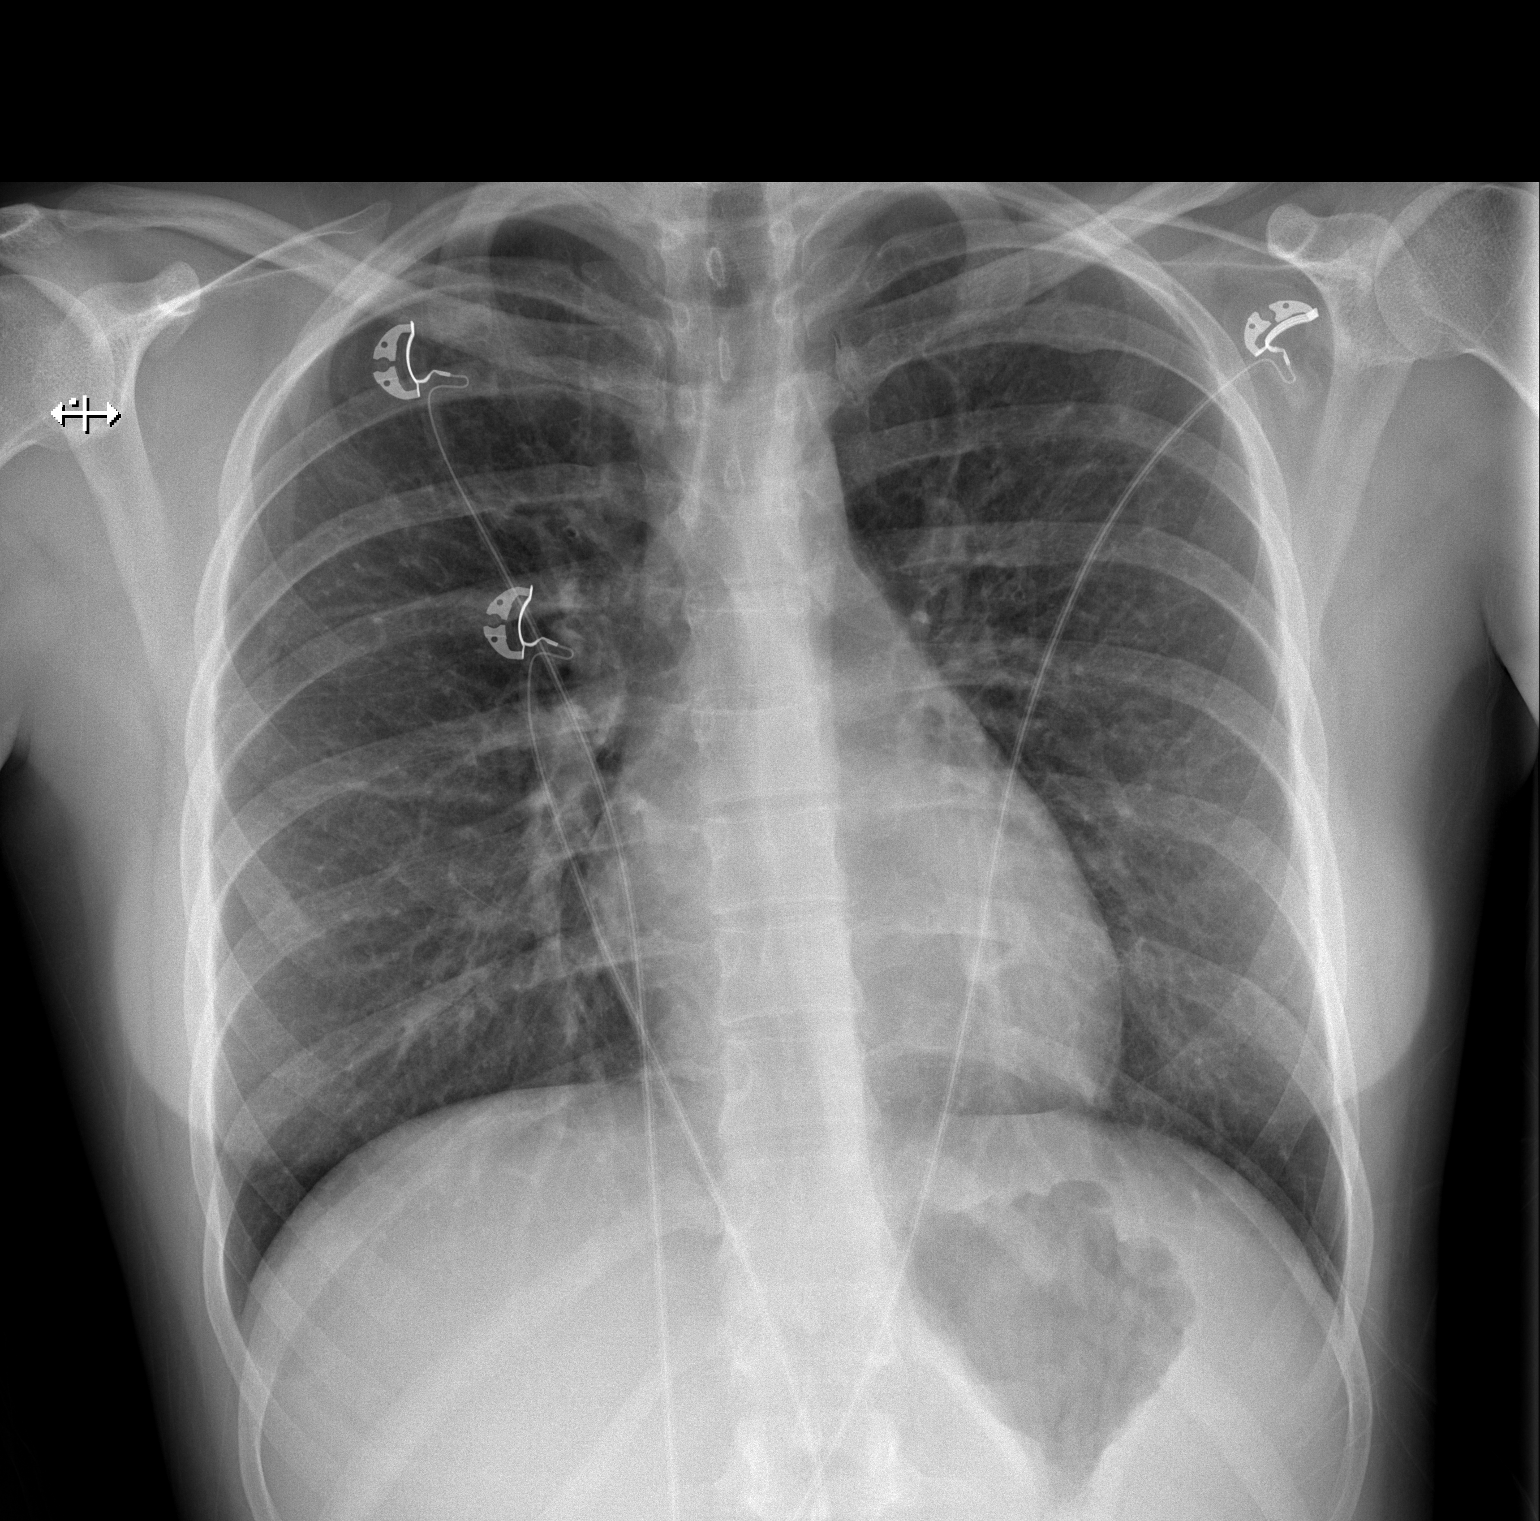

[1 of 1 positions shown; findings below may reference images not displayed]

FINDINGS: The heart size and mediastinal contours are within normal limits.
Both lungs are clear. Question old fracture of the of the left
fourth rib. Clinical correlation is necessary.
IMPRESSION: No active disease.

## 2018-07-13 IMAGING — NM NM PULMONARY VENT & PERF
8 series · 8 of 8 positions shown · non-contrast
Comparison: None.

CLINICAL DATA: Chest pain shortness of breath since yesterday.
Twenty-eight weeks pregnant.

EXAM:
NUCLEAR MEDICINE  PERFUSION LUNG SCAN
TECHNIQUE: Perfusion images were obtained in multiple projections after
intravenous injection of Rc-JJm MAA.
RADIOPHARMACEUTICALS:  1.55 mCi 5echnetium-PPm MAA IV

[Series 1: ant post vent · 2.07mm/px · 1 of 1 slices shown (1 of 2)]
[im 1/1]
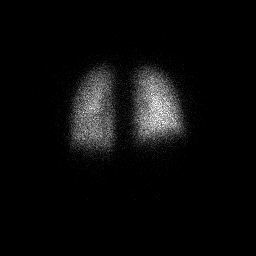

[Series 1: ant post vent · 2.07mm/px · 1 of 1 slices shown (2 of 2)]
[im 1/1]
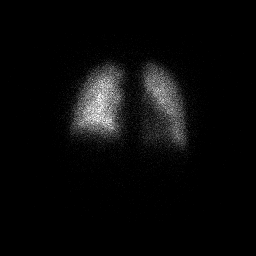

[Series 2: lao-rpo vent · 2.07mm/px · 1 of 1 slices shown (1 of 2)]
[im 1/1]
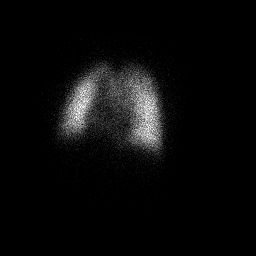

[Series 2: lao-rpo vent · 2.07mm/px · 1 of 1 slices shown (2 of 2)]
[im 1/1]
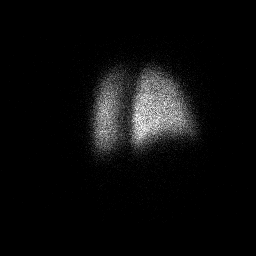

[Series 3: llat-rlat vent · 2.07mm/px · 1 of 1 slices shown (1 of 2)]
[im 1/1]
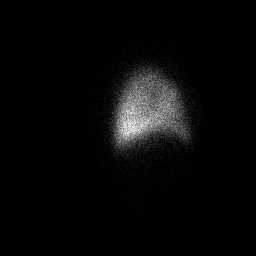

[Series 3: llat-rlat vent · 2.07mm/px · 1 of 1 slices shown (2 of 2)]
[im 1/1]
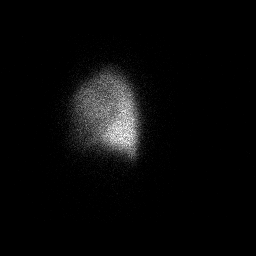

[Series 4: lpo-rao vent · 2.07mm/px · 1 of 1 slices shown (1 of 2)]
[im 1/1]
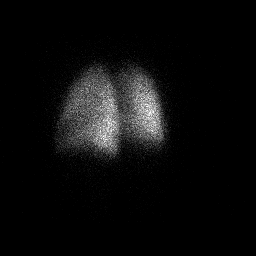

[Series 4: lpo-rao vent · 2.07mm/px · 1 of 1 slices shown (2 of 2)]
[im 1/1]
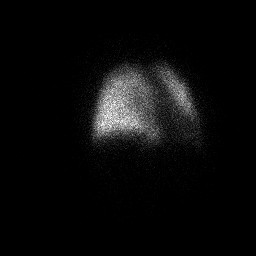

[8 of 8 positions shown; findings below may reference images not displayed]

FINDINGS: Perfusion: No wedge shaped peripheral perfusion defects to suggest
acute pulmonary embolism.
IMPRESSION: Normal perfusion lung scan.
